# Patient Record
Sex: Male | Born: 1965 | Hispanic: No | Marital: Married | Smoking: Never smoker
Health system: Southern US, Community
[De-identification: ages and names within clinical notes are randomized; demographics above are authoritative.]

## PROBLEM LIST (undated history)

## (undated) DIAGNOSIS — E291 Testicular hypofunction: Secondary | ICD-10-CM

## (undated) DIAGNOSIS — I10 Essential (primary) hypertension: Secondary | ICD-10-CM

## (undated) DIAGNOSIS — E785 Hyperlipidemia, unspecified: Secondary | ICD-10-CM

## (undated) HISTORY — DX: Essential (primary) hypertension: I10

## (undated) HISTORY — DX: Testicular hypofunction: E29.1

## (undated) HISTORY — DX: Hyperlipidemia, unspecified: E78.5

---

## 2004-06-05 ENCOUNTER — Observation Stay (HOSPITAL_COMMUNITY): Admission: RE | Admit: 2004-06-05 | Discharge: 2004-06-06 | Payer: Self-pay | Admitting: Urology

## 2010-02-16 DIAGNOSIS — E291 Testicular hypofunction: Secondary | ICD-10-CM

## 2010-02-16 HISTORY — DX: Testicular hypofunction: E29.1

## 2011-04-24 ENCOUNTER — Ambulatory Visit (INDEPENDENT_AMBULATORY_CARE_PROVIDER_SITE_OTHER): Payer: 59 | Admitting: Internal Medicine

## 2011-04-24 ENCOUNTER — Other Ambulatory Visit (INDEPENDENT_AMBULATORY_CARE_PROVIDER_SITE_OTHER): Payer: 59

## 2011-04-24 ENCOUNTER — Encounter: Payer: Self-pay | Admitting: Internal Medicine

## 2011-04-24 VITALS — BP 150/98 | HR 78 | Temp 98.7°F | Resp 16 | Ht 69.0 in | Wt 197.0 lb

## 2011-04-24 DIAGNOSIS — Z8042 Family history of malignant neoplasm of prostate: Secondary | ICD-10-CM

## 2011-04-24 DIAGNOSIS — Z0000 Encounter for general adult medical examination without abnormal findings: Secondary | ICD-10-CM

## 2011-04-24 DIAGNOSIS — E7800 Pure hypercholesterolemia, unspecified: Secondary | ICD-10-CM

## 2011-04-24 DIAGNOSIS — J019 Acute sinusitis, unspecified: Secondary | ICD-10-CM

## 2011-04-24 DIAGNOSIS — I10 Essential (primary) hypertension: Secondary | ICD-10-CM

## 2011-04-24 DIAGNOSIS — R9431 Abnormal electrocardiogram [ECG] [EKG]: Secondary | ICD-10-CM

## 2011-04-24 LAB — CBC WITH DIFFERENTIAL/PLATELET
Basophils Absolute: 0 10*3/uL (ref 0.0–0.1)
Basophils Relative: 0.5 % (ref 0.0–3.0)
Eosinophils Absolute: 0.3 10*3/uL (ref 0.0–0.7)
HCT: 48.3 % (ref 39.0–52.0)
Lymphs Abs: 2.6 10*3/uL (ref 0.7–4.0)
MCHC: 33.7 g/dL (ref 30.0–36.0)
MCV: 84.2 fl (ref 78.0–100.0)
Monocytes Absolute: 0.8 10*3/uL (ref 0.1–1.0)
Neutro Abs: 4.8 10*3/uL (ref 1.4–7.7)
Neutrophils Relative %: 55.8 % (ref 43.0–77.0)
WBC: 8.6 10*3/uL (ref 4.5–10.5)

## 2011-04-24 LAB — COMPREHENSIVE METABOLIC PANEL
AST: 35 U/L (ref 0–37)
CO2: 28 mEq/L (ref 19–32)
Chloride: 102 mEq/L (ref 96–112)
Glucose, Bld: 78 mg/dL (ref 70–99)
Potassium: 4.1 mEq/L (ref 3.5–5.1)
Total Protein: 6.7 g/dL (ref 6.0–8.3)

## 2011-04-24 LAB — URINALYSIS, ROUTINE W REFLEX MICROSCOPIC
Bilirubin Urine: NEGATIVE
Ketones, ur: NEGATIVE
Leukocytes, UA: NEGATIVE
Nitrite: NEGATIVE
Urine Glucose: NEGATIVE

## 2011-04-24 LAB — LDL CHOLESTEROL, DIRECT: Direct LDL: 156.8 mg/dL

## 2011-04-24 MED ORDER — AMLODIPINE-OLMESARTAN 5-20 MG PO TABS: 1.0000 | ORAL_TABLET | Freq: Every day | ORAL | Status: DC

## 2011-04-24 MED ORDER — AMOXICILLIN 875 MG PO TABS: 875.0000 mg | ORAL_TABLET | Freq: Two times a day (BID) | ORAL | Status: AC

## 2011-04-24 NOTE — Patient Instructions (Signed)
Hypertension As your heart beats, it forces blood through your arteries. This force is your blood pressure. If the pressure is too high, it is called hypertension (HTN) or high blood pressure. HTN is dangerous because you may have it and not know it. High blood pressure may mean that your heart has to work harder to pump blood. Your arteries may be narrow or stiff. The extra work puts you at risk for heart disease, stroke, and other problems.  Blood pressure consists of two numbers, a higher number over a lower, 110/72, for example. It is stated as "110 over 72." The ideal is below 120 for the top number (systolic) and under 80 for the bottom (diastolic). Write down your blood pressure today. You should pay close attention to your blood pressure if you have certain conditions such as:  Heart failure.   Prior heart attack.   Diabetes   Chronic kidney disease.   Prior stroke.   Multiple risk factors for heart disease.  To see if you have HTN, your blood pressure should be measured while you are seated with your arm held at the level of the heart. It should be measured at least twice. A one-time elevated blood pressure reading (especially in the Emergency Department) does not mean that you need treatment. There may be conditions in which the blood pressure is different between your right and left arms. It is important to see your caregiver soon for a recheck. Most people have essential hypertension which means that there is not a specific cause. This type of high blood pressure may be lowered by changing lifestyle factors such as:  Stress.   Smoking.   Lack of exercise.   Excessive weight.   Drug/tobacco/alcohol use.   Eating less salt.  Most people do not have symptoms from high blood pressure until it has caused damage to the body. Effective treatment can often prevent, delay or reduce that damage. TREATMENT  When a cause has been identified, treatment for high blood pressure is  directed at the cause. There are a large number of medications to treat HTN. These fall into several categories, and your caregiver will help you select the medicines that are best for you. Medications may have side effects. You should review side effects with your caregiver. If your blood pressure stays high after you have made lifestyle changes or started on medicines,   Your medication(s) may need to be changed.   Other problems may need to be addressed.   Be certain you understand your prescriptions, and know how and when to take your medicine.   Be sure to follow up with your caregiver within the time frame advised (usually within two weeks) to have your blood pressure rechecked and to review your medications.   If you are taking more than one medicine to lower your blood pressure, make sure you know how and at what times they should be taken. Taking two medicines at the same time can result in blood pressure that is too low.  SEEK IMMEDIATE MEDICAL CARE IF:  You develop a severe headache, blurred or changing vision, or confusion.   You have unusual weakness or numbness, or a faint feeling.   You have severe chest or abdominal pain, vomiting, or breathing problems.  MAKE SURE YOU:   Understand these instructions.   Will watch your condition.   Will get help right away if you are not doing well or get worse.  Document Released: 02/02/2005 Document Revised: 01/22/2011 Document Reviewed:   09/23/2007 [employer] Patient Information 2012 [employer].Health Maintenance, Males A healthy lifestyle and preventative care can promote health and wellness.  Maintain regular health, dental, and eye exams.   Eat a healthy diet. Foods like vegetables, fruits, whole grains, low-fat dairy products, and lean protein foods contain the nutrients you need without too many calories. Decrease your intake of foods high in solid fats, added sugars, and salt. Get information about a proper diet from your  caregiver, if necessary.   Regular physical exercise is one of the most important things you can do for your health. Most adults should get at least 150 minutes of moderate-intensity exercise (any activity that increases your heart rate and causes you to sweat) each week. In addition, most adults need muscle-strengthening exercises on 2 or more days a week.    Maintain a healthy weight. The body mass index (BMI) is a screening tool to identify possible weight problems. It provides an estimate of body fat based on height and weight. Your caregiver can help determine your BMI, and can help you achieve or maintain a healthy weight. For adults 20 years and older:   A BMI below 18.5 is considered underweight.   A BMI of 18.5 to 24.9 is normal.   A BMI of 25 to 29.9 is considered overweight.   A BMI of 30 and above is considered obese.   Maintain normal blood lipids and cholesterol by exercising and minimizing your intake of saturated fat. Eat a balanced diet with plenty of fruits and vegetables. Blood tests for lipids and cholesterol should begin at age 20 and be repeated every 5 years. If your lipid or cholesterol levels are high, you are over 50, or you are a high risk for heart disease, you may need your cholesterol levels checked more frequently.Ongoing high lipid and cholesterol levels should be treated with medicines, if diet and exercise are not effective.   If you smoke, find out from your caregiver how to quit. If you do not use tobacco, do not start.   If you choose to drink alcohol, do not exceed 2 drinks per day. One drink is considered to be 12 ounces (355 mL) of beer, 5 ounces (148 mL) of wine, or 1.5 ounces (44 mL) of liquor.   Avoid use of street drugs. Do not share needles with anyone. Ask for help if you need support or instructions about stopping the use of drugs.   High blood pressure causes heart disease and increases the risk of stroke. Blood pressure should be checked at  least every 1 to 2 years. Ongoing high blood pressure should be treated with medicines if weight loss and exercise are not effective.   If you are 45 to 45 years old, ask your caregiver if you should take aspirin to prevent heart disease.   Diabetes screening involves taking a blood sample to check your fasting blood sugar level. This should be done once every 3 years, after age 45, if you are within normal weight and without risk factors for diabetes. Testing should be considered at a younger age or be carried out more frequently if you are overweight and have at least 1 risk factor for diabetes.   Colorectal cancer can be detected and often prevented. Most routine colorectal cancer screening begins at the age of 50 and continues through age 75. However, your caregiver may recommend screening at an earlier age if you have risk factors for colon cancer. On a yearly basis, your caregiver may provide   home test kits to check for hidden blood in the stool. Use of a small camera at the end of a tube, to directly examine the colon (sigmoidoscopy or colonoscopy), can detect the earliest forms of colorectal cancer. Talk to your caregiver about this at age 50, when routine screening begins. Direct examination of the colon should be repeated every 5 to 10 years through age 75, unless early forms of pre-cancerous polyps or small growths are found.   Hepatitis C blood testing is recommended for all people born from 1945 through 1965 and any individual with known risks for hepatitis C.   Healthy men should no longer receive prostate-specific antigen (PSA) blood tests as part of routine cancer screening. Consult with your caregiver about prostate cancer screening.   Testicular cancer screening is not recommended for adolescents or adult males who have no symptoms. Screening includes self-exam, caregiver exam, and other screening tests. Consult with your caregiver about any symptoms you have or any concerns you have  about testicular cancer.   Practice safe sex. Use condoms and avoid high-risk sexual practices to reduce the spread of sexually transmitted infections (STIs).   Use sunscreen with a sun protection factor (SPF) of 30 or greater. Apply sunscreen liberally and repeatedly throughout the day. You should seek shade when your shadow is shorter than you. Protect yourself by wearing long sleeves, pants, a wide-brimmed hat, and sunglasses year round, whenever you are outdoors.   Notify your caregiver of new moles or changes in moles, especially if there is a change in shape or color. Also notify your caregiver if a mole is larger than the size of a pencil eraser.   A one-time screening for abdominal aortic aneurysm (AAA) and surgical repair of large AAAs by sound wave imaging (ultrasonography) is recommended for ages 65 to 75 years who are current or former smokers.   Stay current with your immunizations.  Document Released: 08/01/2007 Document Revised: 01/22/2011 Document Reviewed: 06/30/2010 [employer] Patient Information 2012 [employer]. 

## 2011-04-24 NOTE — Progress Notes (Signed)
Subjective:    Patient ID: Gregory Rivers, male    DOB: [birth year], 46 y.o.   MRN: [id number]  Hypertension This is a chronic problem. The current episode started more than 1 year ago. The problem has been gradually worsening since onset. The problem is uncontrolled. Pertinent negatives include no anxiety, blurred vision, chest pain, headaches, malaise/fatigue, neck pain, orthopnea, palpitations, peripheral edema, PND, shortness of breath or sweats. There are no associated agents to hypertension. Past treatments include nothing. Compliance problems include exercise and diet.  There is no history of chronic renal disease.  Sinusitis This is a new problem. The current episode started 1 to 4 weeks ago. The problem has been gradually worsening since onset. There has been no fever. The fever has been present for less than 1 day. His pain is at a severity of 0/10. He is experiencing no pain. Associated symptoms include chills, congestion, coughing (productive of yellow phlegm), sinus pressure, sneezing and a sore throat. Pertinent negatives include no diaphoresis, ear pain, headaches, hoarse voice, neck pain, shortness of breath or swollen glands.  Hyperlipidemia The current episode started more than 1 year ago. The problem is controlled. Recent lipid tests were reviewed and are variable. Exacerbating diseases include obesity. He has no history of chronic renal disease, diabetes, hypothyroidism, liver disease or nephrotic syndrome. Factors aggravating his hyperlipidemia include no known factors. Pertinent negatives include no chest pain, focal sensory loss, focal weakness, leg pain, myalgias or shortness of breath. Current antihyperlipidemic treatment includes fibric acid derivatives. The current treatment provides moderate improvement of lipids. Compliance problems include adherence to exercise and adherence to diet.       Review of Systems  Constitutional: Positive for chills and unexpected weight change  (weight gain). Negative for fever, malaise/fatigue, diaphoresis, activity change, appetite change and fatigue.  HENT: Positive for congestion, sore throat, rhinorrhea, sneezing, postnasal drip and sinus pressure. Negative for hearing loss, ear pain, nosebleeds, hoarse voice, facial swelling, drooling, mouth sores, trouble swallowing, neck pain, neck stiffness, dental problem, voice change, tinnitus and ear discharge.   Eyes: Negative.  Negative for blurred vision.  Respiratory: Positive for cough (productive of yellow phlegm). Negative for apnea, choking, chest tightness, shortness of breath, wheezing and stridor.   Cardiovascular: Negative for chest pain, palpitations, orthopnea, leg swelling and PND.  Gastrointestinal: Negative for nausea, vomiting, abdominal pain, diarrhea, constipation, blood in stool, abdominal distention, anal bleeding and rectal pain.  Genitourinary: Negative for dysuria, urgency, frequency, hematuria, flank pain, decreased urine volume, discharge, penile swelling, scrotal swelling, enuresis, difficulty urinating, genital sores, penile pain and testicular pain.  Musculoskeletal: Negative for myalgias, back pain, joint swelling, arthralgias and gait problem.  Skin: Negative for color change, pallor, rash and wound.  Neurological: Negative for dizziness, tremors, focal weakness, seizures, syncope, facial asymmetry, speech difficulty, weakness, light-headedness, numbness and headaches.  Hematological: Negative for adenopathy. Does not bruise/bleed easily.  Psychiatric/Behavioral: Negative.        Objective:   Physical Exam  Vitals reviewed. Constitutional: He is oriented to person, place, and time. He appears well-developed and well-nourished. No distress.  HENT:  Head: Normocephalic and atraumatic. No trismus in the jaw.  Right Ear: Hearing, tympanic membrane, external ear and ear canal normal.  Left Ear: Hearing, tympanic membrane, external ear and ear canal normal.    Nose: Mucosal edema and rhinorrhea present. No nose lacerations, sinus tenderness, nasal deformity, septal deviation or nasal septal hematoma. No epistaxis.  No foreign bodies. Right sinus exhibits maxillary sinus tenderness. Right sinus exhibits  no frontal sinus tenderness. Left sinus exhibits maxillary sinus tenderness. Left sinus exhibits no frontal sinus tenderness.  Mouth/Throat: Oropharynx is clear and moist and mucous membranes are normal. Mucous membranes are not pale, not dry and not cyanotic. No uvula swelling. No oropharyngeal exudate, posterior oropharyngeal edema, posterior oropharyngeal erythema or tonsillar abscesses.  Eyes: Conjunctivae are normal. Right eye exhibits no discharge. Left eye exhibits no discharge. No scleral icterus.  Neck: Normal range of motion. Neck supple. No JVD present. No tracheal deviation present. No thyromegaly present.  Cardiovascular: Normal rate, regular rhythm, normal heart sounds and intact distal pulses.  Exam reveals no gallop and no friction rub.   No murmur heard. Pulmonary/Chest: Effort normal and breath sounds normal. No stridor. No respiratory distress. He has no wheezes. He has no rales. He exhibits no tenderness.  Abdominal: Soft. Bowel sounds are normal. He exhibits no distension and no mass. There is no tenderness. There is no rebound and no guarding. Hernia confirmed negative in the right inguinal area and confirmed negative in the left inguinal area.  Genitourinary: Rectum normal, prostate normal, testes normal and penis normal. Rectal exam shows no external hemorrhoid, no internal hemorrhoid, no fissure, no mass, no tenderness and anal tone normal. Guaiac negative stool. Prostate is not enlarged and not tender. Right testis shows no mass, no swelling and no tenderness. Right testis is descended. Left testis shows no mass and no swelling. Left testis is descended. Circumcised. No penile tenderness. No discharge found.  Musculoskeletal: Normal  range of motion. He exhibits no edema and no tenderness.  Lymphadenopathy:    He has no cervical adenopathy.       Right: No inguinal adenopathy present.       Left: No inguinal adenopathy present.  Neurological: He is oriented to person, place, and time.  Skin: Skin is warm and dry. No rash noted. He is not diaphoretic. No erythema. No pallor.  Psychiatric: He has a normal mood and affect. His behavior is normal. Judgment and thought content normal.          Assessment & Plan:

## 2011-04-26 ENCOUNTER — Encounter: Payer: Self-pay | Admitting: Internal Medicine

## 2011-04-26 NOTE — Assessment & Plan Note (Signed)
Start amoxil for the infection 

## 2011-04-26 NOTE — Assessment & Plan Note (Signed)
PSA check today

## 2011-04-26 NOTE — Assessment & Plan Note (Signed)
For an FLP today 

## 2011-04-26 NOTE — Assessment & Plan Note (Signed)
Start azor to control his BP

## 2011-04-26 NOTE — Assessment & Plan Note (Signed)
His EKG shows mild LAE and loss of T waves laterally, this may be a sign of hypertensive heart disease but he also has a significant family history so in addition to treating the HTN I have also sent him to see cardiology

## 2011-04-26 NOTE — Assessment & Plan Note (Signed)
Exam done, labs ordered, vaccines were updated, pt ed material was given 

## 2011-04-27 ENCOUNTER — Encounter: Payer: Self-pay | Admitting: Cardiovascular Disease

## 2011-04-27 ENCOUNTER — Ambulatory Visit (INDEPENDENT_AMBULATORY_CARE_PROVIDER_SITE_OTHER): Payer: 59 | Admitting: Cardiovascular Disease

## 2011-04-27 VITALS — BP 130/78 | HR 70 | Ht 69.0 in | Wt 196.8 lb

## 2011-04-27 DIAGNOSIS — E7800 Pure hypercholesterolemia, unspecified: Secondary | ICD-10-CM

## 2011-04-27 DIAGNOSIS — R9431 Abnormal electrocardiogram [ECG] [EKG]: Secondary | ICD-10-CM

## 2011-04-27 NOTE — Assessment & Plan Note (Signed)
LAE and nonspecific ST/T wave changes.  New Rx HTN and on cholesterol medicine.  Echo to assess chamber sizes assess LV mass and secondary effects of HTN

## 2011-04-27 NOTE — Progress Notes (Signed)
Patient ID: Gregory Rivers, male   DOB: [birth year], 46 y.o.   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  Recent Rx for HTN started.  Family history of premature valvular and ischemic disease.  Father is a patient at [facility] [employer].  No exertional SSCP, dyspnea or palpitaitons.  No previous cardiac w/u.  No old ECG;s available.  Tolerating amlodipine well.  Taking a baby aspirin daily.    ROS: Denies fever, malais, weight loss, blurry vision, decreased visual acuity, cough, sputum, SOB, hemoptysis, pleuritic pain, palpitaitons, heartburn, abdominal pain, melena, lower extremity edema, claudication, or rash.  All other systems reviewed and negative   General: Affect appropriate Healthy:  appears stated age HEENT: normal Neck supple with no adenopathy JVP normal no bruits no thyromegaly Lungs clear with no wheezing and good diaphragmatic motion Heart:  S1/S2 no murmur,rub, gallop or click PMI normal Abdomen: benighn, BS positve, no tenderness, no AAA no bruit.  No HSM or HJR Distal pulses intact with no bruits No edema Neuro non-focal Skin warm and dry No muscular weakness  Medications Current Outpatient Prescriptions  Medication Sig Dispense Refill  . amLODipine-olmesartan (AZOR) 5-20 MG per tablet Take 1 tablet by mouth daily.  56 tablet  0  . amoxicillin (AMOXIL) 875 MG tablet Take 1 tablet (875 mg total) by mouth 2 (two) times daily.  20 tablet  0  . aspirin 81 MG tablet Take 81 mg by mouth daily.      . fenofibrate 160 MG tablet Take 160 mg by mouth daily.      Marland Kitchen FLUoxetine (PROZAC) 10 MG tablet Take 10 mg by mouth daily.      . Testosterone (AXIRON) 30 MG/ACT SOLN Place onto the skin daily.        Allergies Review of patient's allergies indicates no known allergies.  Family History: Family History  Problem Relation Age of Onset  . Heart disease Mother   . Hypertension Mother   . Diabetes Mother   . Prostate cancer Father   . Hyperlipidemia Father   .  Heart disease Father   . Stroke Father   . Hypertension Father     Social History: History   Social History  . Marital Status: Married    Spouse Name: N/A    Number of Children: N/A  . Years of Education: N/A   Occupational History  . Not on file.   Social History Main Topics  . Smoking status: Never Smoker   . Smokeless tobacco: Never Used  . Alcohol Use: No  . Drug Use: No  . Sexually Active: Yes   Other Topics Concern  . Not on file   Social History Narrative  . No narrative on file    Electrocardiogram:   04/24/11  NSR rate 79 nonspecfic ST/T wave change LAE.    Assessment and Plan

## 2011-04-27 NOTE — Assessment & Plan Note (Signed)
Cholesterol is at goal.  Continue current dose of statin and diet Rx.  No myalgias or side effects.  F/U  LFT's in 6 months. No results found for this basename: LDLCALC  On fibrates F/U Dr Yetta Barre needs F/U labs

## 2011-04-27 NOTE — Patient Instructions (Signed)
Your physician wants you to follow-up in: YEAR WITH DR Haywood Filler will receive a reminder letter in the mail two months in advance. If you don't receive a letter, please call our office to schedule the follow-up appointment. Your physician recommends that you continue on your current medications as directed. Please refer to the Current Medication list given to you today. Your physician has requested that you have an echocardiogram. Echocardiography is a painless test that uses sound waves to create images of your heart. It provides your doctor with information about the size and shape of your heart and how well your heart's chambers and valves are working. This procedure takes approximately one hour. There are no restrictions for this procedure. DX ABNORMAL EKG

## 2011-05-08 ENCOUNTER — Other Ambulatory Visit: Payer: Self-pay

## 2011-05-08 ENCOUNTER — Ambulatory Visit (HOSPITAL_COMMUNITY): Payer: 59 | Attending: Internal Medicine

## 2011-05-08 DIAGNOSIS — R9431 Abnormal electrocardiogram [ECG] [EKG]: Secondary | ICD-10-CM | POA: Insufficient documentation

## 2011-05-08 DIAGNOSIS — I10 Essential (primary) hypertension: Secondary | ICD-10-CM | POA: Insufficient documentation

## 2011-05-08 DIAGNOSIS — E785 Hyperlipidemia, unspecified: Secondary | ICD-10-CM | POA: Insufficient documentation

## 2011-05-11 ENCOUNTER — Telehealth: Payer: Self-pay | Admitting: Cardiovascular Disease

## 2011-05-11 NOTE — Telephone Encounter (Signed)
Pt rtn your call

## 2011-05-11 NOTE — Telephone Encounter (Signed)
PT AWARE OF ECHO RESULTS./CY 

## 2011-05-29 ENCOUNTER — Encounter: Payer: Self-pay | Admitting: Internal Medicine

## 2011-05-29 ENCOUNTER — Ambulatory Visit (INDEPENDENT_AMBULATORY_CARE_PROVIDER_SITE_OTHER): Payer: 59 | Admitting: Internal Medicine

## 2011-05-29 VITALS — BP 118/80 | HR 78 | Temp 98.1°F | Wt 194.0 lb

## 2011-05-29 DIAGNOSIS — E7800 Pure hypercholesterolemia, unspecified: Secondary | ICD-10-CM

## 2011-05-29 DIAGNOSIS — R0683 Snoring: Secondary | ICD-10-CM

## 2011-05-29 DIAGNOSIS — I10 Essential (primary) hypertension: Secondary | ICD-10-CM

## 2011-05-29 DIAGNOSIS — G4733 Obstructive sleep apnea (adult) (pediatric): Secondary | ICD-10-CM | POA: Insufficient documentation

## 2011-05-29 DIAGNOSIS — R0609 Other forms of dyspnea: Secondary | ICD-10-CM

## 2011-05-29 NOTE — Patient Instructions (Signed)
Hypercholesterolemia High Blood Cholesterol Cholesterol is a white, waxy, fat-like protein needed by your body in small amounts. The liver makes all the cholesterol you need. It is carried from the liver by the blood through the blood vessels. Deposits (plaque) may build up on blood vessel walls. This makes the arteries narrower and stiffer. Plaque increases the risk for heart attack and stroke. You cannot feel your cholesterol level even if it is very high. The only way to know is by a blood test to check your lipid (fats) levels. Once you know your cholesterol levels, you should keep a record of the test results. Work with your caregiver to to keep your levels in the desired range. WHAT THE RESULTS MEAN:  Total cholesterol is a rough measure of all the cholesterol in your blood.   LDL is the so-called bad cholesterol. This is the type that deposits cholesterol in the walls of the arteries. You want this level to be low.   HDL is the good cholesterol because it cleans the arteries and carries the LDL away. You want this level to be high.   Triglycerides are fat that the body can either burn for energy or store. High levels are closely linked to heart disease.  DESIRED LEVELS:  Total cholesterol below 200.   LDL below 100 for people at risk, below 70 for very high risk.   HDL above 50 is good, above 60 is best.   Triglycerides below 150.  HOW TO LOWER YOUR CHOLESTEROL:  Diet.   Choose fish or white meat chicken and turkey, roasted or baked. Limit fatty cuts of red meat, fried foods, and processed meats, such as sausage and lunch meat.   Eat lots of fresh fruits and vegetables. Choose whole grains, beans, pasta, potatoes and cereals.   Use only small amounts of olive, corn or canola oils. Avoid butter, mayonnaise, shortening or palm kernel oils. Avoid foods with trans-fats.   Use skim/nonfat milk and low-fat/nonfat yogurt and cheeses. Avoid whole milk, cream, ice cream, egg yolks and  cheeses. Healthy desserts include angel food cake, gingersnaps, animal crackers, hard candy, popsicles, and low-fat/nonfat frozen yogurt. Avoid pastries, cakes, pies and cookies.   Exercise.   A regular program helps decrease LDL and raises HDL.   Helps with weight control.   Do things that increase your activity level like gardening, walking, or taking the stairs.   Medication.   May be prescribed by your caregiver to help lowering cholesterol and the risk for heart disease.   You may need medicine even if your levels are normal if you have several risk factors.  HOME CARE INSTRUCTIONS   Follow your diet and exercise programs as suggested by your caregiver.   Take medications as directed.   Have blood work done when your caregiver feels it is necessary.  MAKE SURE YOU:   Understand these instructions.   Will watch your condition.   Will get help right away if you are not doing well or get worse.  Document Released: 02/02/2005 Document Revised: 01/22/2011 Document Reviewed: 07/21/2006 [employer] Patient Information 2012 [employer].Hypertension As your heart beats, it forces blood through your arteries. This force is your blood pressure. If the pressure is too high, it is called hypertension (HTN) or high blood pressure. HTN is dangerous because you may have it and not know it. High blood pressure may mean that your heart has to work harder to pump blood. Your arteries may be narrow or stiff. The extra work   puts you at risk for heart disease, stroke, and other problems.  Blood pressure consists of two numbers, a higher number over a lower, 110/72, for example. It is stated as "110 over 72." The ideal is below 120 for the top number (systolic) and under 80 for the bottom (diastolic). Write down your blood pressure today. You should pay close attention to your blood pressure if you have certain conditions such as:  Heart failure.   Prior heart attack.   Diabetes   Chronic  kidney disease.   Prior stroke.   Multiple risk factors for heart disease.  To see if you have HTN, your blood pressure should be measured while you are seated with your arm held at the level of the heart. It should be measured at least twice. A one-time elevated blood pressure reading (especially in the Emergency Department) does not mean that you need treatment. There may be conditions in which the blood pressure is different between your right and left arms. It is important to see your caregiver soon for a recheck. Most people have essential hypertension which means that there is not a specific cause. This type of high blood pressure may be lowered by changing lifestyle factors such as:  Stress.   Smoking.   Lack of exercise.   Excessive weight.   Drug/tobacco/alcohol use.   Eating less salt.  Most people do not have symptoms from high blood pressure until it has caused damage to the body. Effective treatment can often prevent, delay or reduce that damage. TREATMENT  When a cause has been identified, treatment for high blood pressure is directed at the cause. There are a large number of medications to treat HTN. These fall into several categories, and your caregiver will help you select the medicines that are best for you. Medications may have side effects. You should review side effects with your caregiver. If your blood pressure stays high after you have made lifestyle changes or started on medicines,   Your medication(s) may need to be changed.   Other problems may need to be addressed.   Be certain you understand your prescriptions, and know how and when to take your medicine.   Be sure to follow up with your caregiver within the time frame advised (usually within two weeks) to have your blood pressure rechecked and to review your medications.   If you are taking more than one medicine to lower your blood pressure, make sure you know how and at what times they should be taken.  Taking two medicines at the same time can result in blood pressure that is too low.  SEEK IMMEDIATE MEDICAL CARE IF:  You develop a severe headache, blurred or changing vision, or confusion.   You have unusual weakness or numbness, or a faint feeling.   You have severe chest or abdominal pain, vomiting, or breathing problems.  MAKE SURE YOU:   Understand these instructions.   Will watch your condition.   Will get help right away if you are not doing well or get worse.  Document Released: 02/02/2005 Document Revised: 01/22/2011 Document Reviewed: 09/23/2007 [employer] Patient Information 2012 [employer]. 

## 2011-06-01 NOTE — Assessment & Plan Note (Signed)
He is doing well on trilipix 

## 2011-06-01 NOTE — Assessment & Plan Note (Signed)
His BP is well controlled 

## 2011-06-01 NOTE — Assessment & Plan Note (Signed)
I have asked him to see pulmonary for a sleep evaluation

## 2011-06-01 NOTE — Progress Notes (Signed)
  Subjective:    Patient ID: Gregory Rivers, male    DOB: 1965/04/07, 46 y.o.   MRN: [id number]  Hyperlipidemia This is a chronic problem. The current episode started more than 1 year ago. The problem is controlled. Recent lipid tests were reviewed and are variable. He has no history of chronic renal disease, diabetes, hypothyroidism, liver disease, obesity or nephrotic syndrome. Factors aggravating his hyperlipidemia include no known factors. Pertinent negatives include no chest pain, focal sensory loss, focal weakness, leg pain, myalgias or shortness of breath. Current antihyperlipidemic treatment includes fibric acid derivatives. The current treatment provides moderate improvement of lipids. Compliance problems include adherence to exercise and adherence to diet.   Hypertension This is a chronic problem. The current episode started more than 1 year ago. The problem has been gradually improving since onset. The problem is controlled. Pertinent negatives include no anxiety, blurred vision, chest pain, headaches, malaise/fatigue, neck pain, orthopnea, palpitations, peripheral edema, PND, shortness of breath or sweats. Past treatments include angiotensin blockers and calcium channel blockers. The current treatment provides significant improvement. Compliance problems include exercise and diet.  Identifiable causes of hypertension include sleep apnea. There is no history of chronic renal disease.      Review of Systems  Constitutional: Negative for fever, chills, malaise/fatigue, diaphoresis, activity change, appetite change, fatigue and unexpected weight change.  HENT: Negative.  Negative for neck pain.   Eyes: Negative.  Negative for blurred vision.  Respiratory: Negative for cough, choking, chest tightness, shortness of breath, wheezing and stridor. Apnea: and apnea.   Cardiovascular: Negative for chest pain, palpitations, orthopnea, leg swelling and PND.  Gastrointestinal: Negative for nausea,  vomiting, abdominal pain, diarrhea, constipation, blood in stool and anal bleeding.  Genitourinary: Negative.   Musculoskeletal: Negative for myalgias, back pain, joint swelling, arthralgias and gait problem.  Skin: Negative for color change, pallor, rash and wound.  Neurological: Negative for dizziness, tremors, focal weakness, seizures, syncope, facial asymmetry, speech difficulty, weakness, light-headedness, numbness and headaches.  Hematological: Negative for adenopathy. Does not bruise/bleed easily.  Psychiatric/Behavioral: Negative.        Objective:   Physical Exam  Vitals reviewed. Constitutional: He is oriented to person, place, and time. He appears well-developed and well-nourished. No distress.  HENT:  Head: Normocephalic and atraumatic.  Mouth/Throat: Oropharynx is clear and moist. No oropharyngeal exudate.  Eyes: Conjunctivae are normal. Right eye exhibits no discharge. Left eye exhibits no discharge. No scleral icterus.  Neck: Normal range of motion. Neck supple. No JVD present. No tracheal deviation present. No thyromegaly present.  Cardiovascular: Normal rate, regular rhythm, normal heart sounds and intact distal pulses.  Exam reveals no gallop and no friction rub.   No murmur heard. Pulmonary/Chest: Effort normal and breath sounds normal. No stridor. No respiratory distress. He has no wheezes. He has no rales. He exhibits no tenderness.  Abdominal: Soft. Bowel sounds are normal. He exhibits no distension and no mass. There is no tenderness. There is no rebound and no guarding.  Musculoskeletal: Normal range of motion. He exhibits no edema and no tenderness.  Lymphadenopathy:    He has no cervical adenopathy.  Neurological: He is oriented to person, place, and time.  Skin: Skin is warm and dry. No rash noted. He is not diaphoretic. No erythema. No pallor.  Psychiatric: He has a normal mood and affect. His behavior is normal. Judgment and thought content normal.           Assessment & Plan:

## 2011-06-22 ENCOUNTER — Encounter: Payer: Self-pay | Admitting: Pulmonary Disease

## 2011-06-22 ENCOUNTER — Ambulatory Visit (INDEPENDENT_AMBULATORY_CARE_PROVIDER_SITE_OTHER): Payer: 59 | Admitting: Pulmonary Disease

## 2011-06-22 VITALS — BP 124/80 | HR 89 | Temp 98.5°F | Ht 69.0 in | Wt 197.0 lb

## 2011-06-22 DIAGNOSIS — R0683 Snoring: Secondary | ICD-10-CM

## 2011-06-22 DIAGNOSIS — R0989 Other specified symptoms and signs involving the circulatory and respiratory systems: Secondary | ICD-10-CM

## 2011-06-22 DIAGNOSIS — R0609 Other forms of dyspnea: Secondary | ICD-10-CM

## 2011-06-22 NOTE — Patient Instructions (Signed)
Will set up for a home sleep study, and arrange followup once results are available.

## 2011-06-22 NOTE — Assessment & Plan Note (Signed)
The patient has a history of loud snoring, but also has other history that is very suggestive of sleep disordered breathing.  He has a large neck, and also a family history of obstructive sleep apnea.  At this point, I do think he needs sleep testing for evaluation, and the patient is agreeable.  He is an excellent candidate for home sleep testing given my suspicion, his age, and overall adequate health.

## 2011-06-22 NOTE — Progress Notes (Signed)
  Subjective:    Patient ID: Gregory Rivers, male    DOB: 02-01-1966, 46 y.o.   MRN: [id number]  HPI The patient is a 46 year old male who been asked to see for snoring.  He has been noted by his wife to have loud snoring, as well as an abnormal breathing pattern during sleep.  The patient denies any choking arousals, but does admit that he awakens unrested at least 50% of the mornings.  He has significant sleep pressure during the day with periods of inactivity that interferes with his quality of life, and will fall asleep very easily in the evenings watching television.  He also notes sleep pressure driving longer distances.  The patient states that his weight is up 2-3 pounds over the last few years, and his Epworth score today is 10.  Sleep Questionnaire: What time do you typically go to bed?( Between what hours) 9 pm How long does it take you to fall asleep? 5 to 10 mins How many times during the night do you wake up? 2 What time do you get out of bed to start your day? 0430 Do you drive or operate heavy machinery in your occupation? No How much has your weight changed (up or down) over the past two years? (In pounds) 3 lb (1.361 kg) Have you ever had a sleep study before? No Do you currently use CPAP? No Do you wear oxygen at any time? No    Review of Systems  Constitutional: Negative for fever and unexpected weight change.  HENT: Negative for ear pain, nosebleeds, congestion, sore throat, rhinorrhea, sneezing, trouble swallowing, dental problem, postnasal drip and sinus pressure.   Eyes: Negative for redness and itching.  Respiratory: Negative for cough, chest tightness, shortness of breath and wheezing.   Cardiovascular: Negative for palpitations and leg swelling.  Gastrointestinal: Negative for nausea and vomiting.  Genitourinary: Negative for dysuria.  Musculoskeletal: Negative for joint swelling.  Skin: Negative for rash.  Neurological: Negative for headaches.  Hematological: Does not  bruise/bleed easily.  Psychiatric/Behavioral: Negative for dysphoric mood. The patient is not nervous/anxious.        Objective:   Physical Exam Constitutional:  Overweight male, no acute distress  HENT:  Nares patent without discharge  Oropharynx without exudate, palate and uvula are moderately elongated.  Eyes:  Perrla, eomi, no scleral icterus  Neck:  No JVD, no TMG  Cardiovascular:  Normal rate, regular rhythm, no rubs or gallops.  No murmurs        Intact distal pulses  Pulmonary :  Normal breath sounds, no stridor or respiratory distress   No rales, rhonchi, or wheezing  Abdominal:  Soft, nondistended, bowel sounds present.  No tenderness noted.   Musculoskeletal:  No lower extremity edema noted.  Lymph Nodes:  No cervical lymphadenopathy noted  Skin:  No cyanosis noted  Neurologic:  Alert, appropriate, moves all 4 extremities without obvious deficit.         Assessment & Plan:

## 2011-06-29 ENCOUNTER — Telehealth: Payer: Self-pay | Admitting: Pulmonary Disease

## 2011-06-29 ENCOUNTER — Ambulatory Visit (INDEPENDENT_AMBULATORY_CARE_PROVIDER_SITE_OTHER): Payer: 59 | Admitting: Pulmonary Disease

## 2011-06-29 DIAGNOSIS — R0683 Snoring: Secondary | ICD-10-CM

## 2011-06-29 DIAGNOSIS — G4733 Obstructive sleep apnea (adult) (pediatric): Secondary | ICD-10-CM

## 2011-06-29 NOTE — Telephone Encounter (Signed)
Gregory Rivers, this pt needs ov to review his recent sleep study.  Thanks.

## 2011-07-01 NOTE — Telephone Encounter (Signed)
Pt is scheduled for Fri., 5/17 @ 9:30am to review sleep results.

## 2011-07-03 ENCOUNTER — Ambulatory Visit (INDEPENDENT_AMBULATORY_CARE_PROVIDER_SITE_OTHER): Payer: 59 | Admitting: Pulmonary Disease

## 2011-07-03 ENCOUNTER — Encounter: Payer: Self-pay | Admitting: Pulmonary Disease

## 2011-07-03 VITALS — BP 118/80 | HR 78 | Temp 98.4°F | Ht 69.0 in | Wt 194.0 lb

## 2011-07-03 DIAGNOSIS — G4733 Obstructive sleep apnea (adult) (pediatric): Secondary | ICD-10-CM

## 2011-07-03 NOTE — Assessment & Plan Note (Signed)
The patient has mild to moderate obstructive sleep apnea by his recent home sleep study.  He is also describing sleep disruption and daytime sleepiness.  Given the severity of his sleep apnea, I do not think this is a big cardiovascular risk for him.  However, he is clearly symptomatic, and will need to decide between conservative or aggressive treatment.  I have outlined a conservative treatment such as a trial of weight loss for the next 4-6 months.  If he wished to treat this more aggressively, I have suggested considering either a dental appliance or CPAP.  The patient is leaning toward a trial of weight loss alone, but I have given him patient education materials to consider further more aggressive therapies as well.

## 2011-07-03 NOTE — Patient Instructions (Signed)
Work on weight loss, but consider whether to start on dental appliance or cpap as well. Please give me some feedback on your decision and how things are going.

## 2011-07-03 NOTE — Progress Notes (Signed)
  Subjective:    Patient ID: Gregory Rivers, male    DOB: 28-May-1965, 46 y.o.   MRN: [id number]  HPI The patient comes in today for followup of his recent sleep test.  He was found to have mild to moderate obstructive sleep apnea with an AHI of 19 events per hour, and oxygen desaturation as low as 78%.  I have reviewed the study with him in detail, and answered all of his questions.   Review of Systems  Constitutional: Negative.  Negative for fever and unexpected weight change.  HENT: Negative.  Negative for ear pain, nosebleeds, congestion, sore throat, rhinorrhea, sneezing, trouble swallowing, dental problem, postnasal drip and sinus pressure.   Eyes: Negative.  Negative for redness and itching.  Respiratory: Negative.  Negative for cough, chest tightness, shortness of breath and wheezing.   Cardiovascular: Negative.  Negative for palpitations and leg swelling.  Gastrointestinal: Negative.  Negative for nausea and vomiting.  Genitourinary: Negative.  Negative for dysuria.  Musculoskeletal: Negative.  Negative for joint swelling.  Skin: Negative.  Negative for rash.  Neurological: Negative.  Negative for headaches.  Hematological: Negative.  Does not bruise/bleed easily.  Psychiatric/Behavioral: Negative.  Negative for dysphoric mood. The patient is not nervous/anxious.        Objective:   Physical Exam Overweight male in no acute distress Nose without purulence or discharge noted Lower extremities without edema, no cyanosis Alert and oriented, moves all 4 extremities.  The patient does appear mildly sleepy during my visit.       Assessment & Plan:

## 2011-07-22 ENCOUNTER — Other Ambulatory Visit: Payer: Self-pay

## 2011-07-22 MED ORDER — FENOFIBRATE 160 MG PO TABS: 160.0000 mg | ORAL_TABLET | Freq: Every day | ORAL | Status: DC

## 2011-09-01 ENCOUNTER — Other Ambulatory Visit: Payer: Self-pay

## 2011-09-01 MED ORDER — FLUOXETINE HCL 10 MG PO TABS: 10.0000 mg | ORAL_TABLET | Freq: Every day | ORAL | Status: DC

## 2011-09-23 ENCOUNTER — Encounter: Payer: Self-pay | Admitting: Internal Medicine

## 2011-09-23 ENCOUNTER — Ambulatory Visit (INDEPENDENT_AMBULATORY_CARE_PROVIDER_SITE_OTHER): Payer: 59 | Admitting: Internal Medicine

## 2011-09-23 VITALS — BP 118/70 | HR 79 | Temp 98.7°F | Resp 16 | Wt 194.2 lb

## 2011-09-23 DIAGNOSIS — E7800 Pure hypercholesterolemia, unspecified: Secondary | ICD-10-CM

## 2011-09-23 DIAGNOSIS — M5416 Radiculopathy, lumbar region: Secondary | ICD-10-CM | POA: Insufficient documentation

## 2011-09-23 DIAGNOSIS — I10 Essential (primary) hypertension: Secondary | ICD-10-CM

## 2011-09-23 DIAGNOSIS — M79605 Pain in left leg: Secondary | ICD-10-CM

## 2011-09-23 DIAGNOSIS — M545 Low back pain, unspecified: Secondary | ICD-10-CM

## 2011-09-23 DIAGNOSIS — IMO0002 Reserved for concepts with insufficient information to code with codable children: Secondary | ICD-10-CM

## 2011-09-23 DIAGNOSIS — M5136 Other intervertebral disc degeneration, lumbar region: Secondary | ICD-10-CM | POA: Insufficient documentation

## 2011-09-23 NOTE — Assessment & Plan Note (Signed)
He has been taking neurontin 300 mg at HS with minimal symptom relief, I asked him to increase that to 600 mg at HS

## 2011-09-23 NOTE — Patient Instructions (Signed)
Back Pain, Adult Low back pain is very common. About 1 in 5 people have back pain.The cause of low back pain is rarely dangerous. The pain often gets better over time.About half of people with a sudden onset of back pain feel better in just 2 weeks. About 8 in 10 people feel better by 6 weeks.  CAUSES Some common causes of back pain include:  Strain of the muscles or ligaments supporting the spine.   Wear and tear (degeneration) of the spinal discs.   Arthritis.   Direct injury to the back.  DIAGNOSIS Most of the time, the direct cause of low back pain is not known.However, back pain can be treated effectively even when the exact cause of the pain is unknown.Answering your caregiver's questions about your overall health and symptoms is one of the most accurate ways to make sure the cause of your pain is not dangerous. If your caregiver needs more information, he or she may order lab work or imaging tests (X-rays or MRIs).However, even if imaging tests show changes in your back, this usually does not require surgery. HOME CARE INSTRUCTIONS For many people, back pain returns.Since low back pain is rarely dangerous, it is often a condition that people can learn to manageon their own.   Remain active. It is stressful on the back to sit or stand in one place. Do not sit, drive, or stand in one place for more than 30 minutes at a time. Take short walks on level surfaces as soon as pain allows.Try to increase the length of time you walk each day.   Do not stay in bed.Resting more than 1 or 2 days can delay your recovery.   Do not avoid exercise or work.Your body is made to move.It is not dangerous to be active, even though your back may hurt.Your back will likely heal faster if you return to being active before your pain is gone.   Pay attention to your body when you bend and lift. Many people have less discomfortwhen lifting if they bend their knees, keep the load close to their  bodies,and avoid twisting. Often, the most comfortable positions are those that put less stress on your recovering back.   Find a comfortable position to sleep. Use a firm mattress and lie on your side with your knees slightly bent. If you lie on your back, put a pillow under your knees.   Only take over-the-counter or prescription medicines as directed by your caregiver. Over-the-counter medicines to reduce pain and inflammation are often the most helpful.Your caregiver may prescribe muscle relaxant drugs.These medicines help dull your pain so you can more quickly return to your normal activities and healthy exercise.   Put ice on the injured area.   Put ice in a plastic bag.   Place a towel between your skin and the bag.   Leave the ice on for 15 to 20 minutes, 3 to 4 times a day for the first 2 to 3 days. After that, ice and heat may be alternated to reduce pain and spasms.   Ask your caregiver about trying back exercises and gentle massage. This may be of some benefit.   Avoid feeling anxious or stressed.Stress increases muscle tension and can worsen back pain.It is important to recognize when you are anxious or stressed and learn ways to manage it.Exercise is a great option.  SEEK MEDICAL CARE IF:  You have pain that is not relieved with rest or medicine.   You have   pain that does not improve in 1 week.   You have new symptoms.   You are generally not feeling well.  SEEK IMMEDIATE MEDICAL CARE IF:   You have pain that radiates from your back into your legs.   You develop new bowel or bladder control problems.   You have unusual weakness or numbness in your arms or legs.   You develop nausea or vomiting.   You develop abdominal pain.   You feel faint.  Document Released: 02/02/2005 Document Revised: 01/22/2011 Document Reviewed: 06/23/2010 [employer] Patient Information 2012 [employer]. 

## 2011-09-23 NOTE — Assessment & Plan Note (Signed)
His BP is well controlled 

## 2011-09-23 NOTE — Progress Notes (Signed)
Subjective:    Patient ID: Gregory Rivers, male    DOB: 1965-08-30, 46 y.o.   MRN: [id number]  Back Pain This is a new problem. Episode onset: 2 months ago. The problem occurs intermittently. The problem has been gradually worsening since onset. The pain is present in the lumbar spine. The quality of the pain is described as stabbing. The pain radiates to the left foot. The pain is at a severity of 6/10. The pain is moderate. The pain is worse during the night. The symptoms are aggravated by standing and lying down. Stiffness is present at night. Associated symptoms include leg pain, numbness (in the left leg), paresthesias and tingling (in the left leg). Pertinent negatives include no abdominal pain, bladder incontinence, bowel incontinence, chest pain, dysuria, fever, headaches, paresis, pelvic pain, perianal numbness, weakness or weight loss. Risk factors include obesity and lack of exercise. He has tried NSAIDs (he saw ? ortho and was given neurontin, it helps some) for the symptoms. The treatment provided moderate relief.      Review of Systems  Constitutional: Negative for fever, chills, weight loss, diaphoresis, activity change, appetite change, fatigue and unexpected weight change.  HENT: Negative.   Eyes: Negative.   Respiratory: Negative for cough, chest tightness, shortness of breath, wheezing and stridor.   Cardiovascular: Negative for chest pain, palpitations and leg swelling.  Gastrointestinal: Negative for nausea, vomiting, abdominal pain, diarrhea, constipation, abdominal distention, anal bleeding and bowel incontinence.  Genitourinary: Negative.  Negative for bladder incontinence, dysuria and pelvic pain.  Musculoskeletal: Positive for back pain. Negative for myalgias, joint swelling, arthralgias and gait problem.  Skin: Negative for color change, pallor, rash and wound.  Neurological: Positive for tingling (in the left leg), numbness (in the left leg) and paresthesias. Negative  for dizziness, tremors, seizures, syncope, facial asymmetry, speech difficulty, weakness, light-headedness and headaches.  Hematological: Negative for adenopathy.  Psychiatric/Behavioral: Negative.        Objective:   Physical Exam  Vitals reviewed. Constitutional: He appears well-developed and well-nourished. No distress.  HENT:  Head: Normocephalic and atraumatic.  Mouth/Throat: Oropharynx is clear and moist. No oropharyngeal exudate.  Eyes: Conjunctivae are normal. Right eye exhibits no discharge. Left eye exhibits no discharge. No scleral icterus.  Neck: Normal range of motion. Neck supple. No JVD present. No tracheal deviation present. No thyromegaly present.  Cardiovascular: Normal rate, regular rhythm, normal heart sounds and intact distal pulses.  Exam reveals no gallop and no friction rub.   No murmur heard. Pulmonary/Chest: Effort normal and breath sounds normal. No stridor. No respiratory distress. He has no wheezes. He has no rales. He exhibits no tenderness.  Abdominal: Soft. Bowel sounds are normal. He exhibits no distension and no mass. There is no tenderness. There is no rebound and no guarding.  Musculoskeletal: Normal range of motion. He exhibits no edema and no tenderness.       Lumbar back: Normal. He exhibits normal range of motion, no tenderness, no bony tenderness, no swelling, no edema, no deformity, no laceration, no pain, no spasm and normal pulse.  Lymphadenopathy:    He has no cervical adenopathy.  Neurological: He is alert. He has normal strength. He displays abnormal reflex. He displays no atrophy and no tremor. No cranial nerve deficit or sensory deficit. He exhibits normal muscle tone. He displays a negative Romberg sign. He displays no seizure activity. Coordination and gait normal. He displays no Babinski's sign on the right side. He displays no Babinski's sign on the left side.  Reflex Scores:      Tricep reflexes are 2+ on the right side and 2+ on the left  side.      Bicep reflexes are 2+ on the right side and 2+ on the left side.      Brachioradialis reflexes are 2+ on the right side and 2+ on the left side.      Patellar reflexes are 2+ on the right side and 1+ on the left side.      Achilles reflexes are 2+ on the right side and 1+ on the left side.      - SLR in BLE  Skin: Skin is warm and dry. No rash noted. He is not diaphoretic. No erythema. No pallor.  Psychiatric: He has a normal mood and affect. His behavior is normal. Judgment and thought content normal.      Lab Results  Component Value Date   WBC 8.6 04/24/2011   HGB 16.3 04/24/2011   HCT 48.3 04/24/2011   PLT 212.0 04/24/2011   GLUCOSE 78 04/24/2011   CHOL 222* 04/24/2011   TRIG 260.0* 04/24/2011   HDL 37.40* 04/24/2011   LDLDIRECT 156.8 04/24/2011   ALT 56* 04/24/2011   AST 35 04/24/2011   NA 139 04/24/2011   K 4.1 04/24/2011   CL 102 04/24/2011   CREATININE 1.3 04/24/2011   BUN 18 04/24/2011   CO2 28 04/24/2011   TSH 1.95 04/24/2011   PSA 0.43 04/24/2011      Assessment & Plan:

## 2011-09-23 NOTE — Assessment & Plan Note (Signed)
He has pain, paresthesias, and abnormal reflexes so I have asked him to get an MRI of L/S spine done to look for spinal stenosis, HNP, tumor, lesion, etc

## 2011-10-16 ENCOUNTER — Ambulatory Visit
Admission: RE | Admit: 2011-10-16 | Discharge: 2011-10-16 | Disposition: A | Discharge status: Home / Self Care | Payer: 59 | Source: Ambulatory Visit | Attending: Internal Medicine | Admitting: Internal Medicine

## 2011-10-16 DIAGNOSIS — M79605 Pain in left leg: Secondary | ICD-10-CM

## 2011-10-16 DIAGNOSIS — M5416 Radiculopathy, lumbar region: Secondary | ICD-10-CM

## 2011-10-19 ENCOUNTER — Encounter: Payer: Self-pay | Admitting: Internal Medicine

## 2011-10-27 ENCOUNTER — Ambulatory Visit (INDEPENDENT_AMBULATORY_CARE_PROVIDER_SITE_OTHER): Payer: 59 | Admitting: Internal Medicine

## 2011-10-27 ENCOUNTER — Encounter: Payer: Self-pay | Admitting: Internal Medicine

## 2011-10-27 VITALS — BP 138/82 | HR 80 | Temp 97.9°F | Resp 16 | Wt 195.0 lb

## 2011-10-27 DIAGNOSIS — M5416 Radiculopathy, lumbar region: Secondary | ICD-10-CM

## 2011-10-27 DIAGNOSIS — I10 Essential (primary) hypertension: Secondary | ICD-10-CM

## 2011-10-27 DIAGNOSIS — M5136 Other intervertebral disc degeneration, lumbar region: Secondary | ICD-10-CM

## 2011-10-27 DIAGNOSIS — M5137 Other intervertebral disc degeneration, lumbosacral region: Secondary | ICD-10-CM

## 2011-10-27 DIAGNOSIS — IMO0002 Reserved for concepts with insufficient information to code with codable children: Secondary | ICD-10-CM

## 2011-10-29 ENCOUNTER — Encounter: Payer: Self-pay | Admitting: Internal Medicine

## 2011-10-29 NOTE — Progress Notes (Signed)
Subjective:    Patient ID: Gregory Rivers, male    DOB: 22-Nov-1965, 46 y.o.   MRN: [id number]  Hypertension This is a chronic problem. The current episode started more than 1 year ago. The problem has been gradually improving since onset. The problem is controlled. Pertinent negatives include no anxiety, blurred vision, chest pain, headaches, malaise/fatigue, neck pain, orthopnea, palpitations, peripheral edema, PND, shortness of breath or sweats. Past treatments include angiotensin blockers and calcium channel blockers. There are no compliance problems.   Back Pain This is a recurrent problem. The current episode started more than 1 month ago. The problem occurs intermittently. The problem has been gradually improving since onset. The pain is present in the lumbar spine. The quality of the pain is described as stabbing and aching. The pain radiates to the left thigh. The pain is at a severity of 2/10. The pain is mild. The pain is worse during the night. The symptoms are aggravated by lying down. Stiffness is present at night. Pertinent negatives include no abdominal pain, bladder incontinence, bowel incontinence, chest pain, dysuria, fever, headaches, leg pain, numbness, paresis, paresthesias, pelvic pain, perianal numbness, tingling, weakness or weight loss. Treatments tried: neurontin. The treatment provided moderate relief.      Review of Systems  Constitutional: Negative for fever, chills, weight loss, malaise/fatigue, diaphoresis, activity change, appetite change, fatigue and unexpected weight change.  HENT: Negative.  Negative for neck pain.   Eyes: Negative.  Negative for blurred vision.  Respiratory: Negative for cough, choking, chest tightness, shortness of breath, wheezing and stridor.   Cardiovascular: Negative for chest pain, palpitations, orthopnea, leg swelling and PND.  Gastrointestinal: Negative for nausea, vomiting, abdominal pain, diarrhea, constipation, blood in stool and bowel  incontinence.  Genitourinary: Negative.  Negative for bladder incontinence, dysuria and pelvic pain.  Musculoskeletal: Positive for back pain. Negative for myalgias, joint swelling, arthralgias and gait problem.  Skin: Negative for color change, pallor, rash and wound.  Neurological: Negative for dizziness, tingling, tremors, seizures, syncope, facial asymmetry, speech difficulty, weakness, light-headedness, numbness, headaches and paresthesias.  Hematological: Negative for adenopathy. Does not bruise/bleed easily.  Psychiatric/Behavioral: Negative.        Objective:   Physical Exam  Vitals reviewed. Constitutional: He is oriented to person, place, and time. He appears well-developed and well-nourished. No distress.  HENT:  Head: Normocephalic and atraumatic.  Mouth/Throat: Oropharynx is clear and moist. No oropharyngeal exudate.  Eyes: Conjunctivae normal are normal. Right eye exhibits no discharge. Left eye exhibits no discharge. No scleral icterus.  Neck: Normal range of motion. Neck supple. No JVD present. No tracheal deviation present. No thyromegaly present.  Cardiovascular: Normal rate, regular rhythm, normal heart sounds and intact distal pulses.  Exam reveals no gallop and no friction rub.   No murmur heard. Pulmonary/Chest: Effort normal and breath sounds normal. No stridor. No respiratory distress. He has no wheezes. He has no rales. He exhibits no tenderness.  Abdominal: Soft. Bowel sounds are normal. He exhibits no distension. There is no tenderness. There is no rebound and no guarding.  Musculoskeletal: Normal range of motion. He exhibits no edema and no tenderness.  Lymphadenopathy:    He has no cervical adenopathy.  Neurological: He is alert and oriented to person, place, and time. He has normal reflexes. He displays normal reflexes. No cranial nerve deficit. He exhibits normal muscle tone. Coordination normal.  Skin: Skin is warm and dry. No rash noted. He is not  diaphoretic. No erythema. No pallor.  Psychiatric: He has  a normal mood and affect. His behavior is normal. Judgment and thought content normal.          Assessment & Plan:

## 2011-10-29 NOTE — Assessment & Plan Note (Signed)
His Bp is well controlled 

## 2011-10-29 NOTE — Assessment & Plan Note (Signed)
Continue neurontin and nsaids as needed

## 2011-10-29 NOTE — Assessment & Plan Note (Signed)
He is doing well on neurontin and does not want to pursue anything else to treat his pain

## 2011-10-29 NOTE — Patient Instructions (Addendum)
Back Pain, Adult Low back pain is very common. About 1 in 5 people have back pain.The cause of low back pain is rarely dangerous. The pain often gets better over time.About half of people with a sudden onset of back pain feel better in just 2 weeks. About 8 in 10 people feel better by 6 weeks.  CAUSES Some common causes of back pain include:  Strain of the muscles or ligaments supporting the spine.   Wear and tear (degeneration) of the spinal discs.   Arthritis.   Direct injury to the back.  DIAGNOSIS Most of the time, the direct cause of low back pain is not known.However, back pain can be treated effectively even when the exact cause of the pain is unknown.Answering your caregiver's questions about your overall health and symptoms is one of the most accurate ways to make sure the cause of your pain is not dangerous. If your caregiver needs more information, he or she may order lab work or imaging tests (X-rays or MRIs).However, even if imaging tests show changes in your back, this usually does not require surgery. HOME CARE INSTRUCTIONS For many people, back pain returns.Since low back pain is rarely dangerous, it is often a condition that people can learn to manageon their own.   Remain active. It is stressful on the back to sit or stand in one place. Do not sit, drive, or stand in one place for more than 30 minutes at a time. Take short walks on level surfaces as soon as pain allows.Try to increase the length of time you walk each day.   Do not stay in bed.Resting more than 1 or 2 days can delay your recovery.   Do not avoid exercise or work.Your body is made to move.It is not dangerous to be active, even though your back may hurt.Your back will likely heal faster if you return to being active before your pain is gone.   Pay attention to your body when you bend and lift. Many people have less discomfortwhen lifting if they bend their knees, keep the load close to their  bodies,and avoid twisting. Often, the most comfortable positions are those that put less stress on your recovering back.   Find a comfortable position to sleep. Use a firm mattress and lie on your side with your knees slightly bent. If you lie on your back, put a pillow under your knees.   Only take over-the-counter or prescription medicines as directed by your caregiver. Over-the-counter medicines to reduce pain and inflammation are often the most helpful.Your caregiver may prescribe muscle relaxant drugs.These medicines help dull your pain so you can more quickly return to your normal activities and healthy exercise.   Put ice on the injured area.   Put ice in a plastic bag.   Place a towel between your skin and the bag.   Leave the ice on for 15 to 20 minutes, 3 to 4 times a day for the first 2 to 3 days. After that, ice and heat may be alternated to reduce pain and spasms.   Ask your caregiver about trying back exercises and gentle massage. This may be of some benefit.   Avoid feeling anxious or stressed.Stress increases muscle tension and can worsen back pain.It is important to recognize when you are anxious or stressed and learn ways to manage it.Exercise is a great option.  SEEK MEDICAL CARE IF:  You have pain that is not relieved with rest or medicine.   You have   pain that does not improve in 1 week.   You have new symptoms.   You are generally not feeling well.  SEEK IMMEDIATE MEDICAL CARE IF:   You have pain that radiates from your back into your legs.   You develop new bowel or bladder control problems.   You have unusual weakness or numbness in your arms or legs.   You develop nausea or vomiting.   You develop abdominal pain.   You feel faint.  Document Released: 02/02/2005 Document Revised: 01/22/2011 Document Reviewed: 06/23/2010 [employer] Patient Information 2012 [employer]. 

## 2011-12-17 ENCOUNTER — Telehealth: Payer: Self-pay | Admitting: Pulmonary Disease

## 2011-12-17 NOTE — Telephone Encounter (Signed)
Spoke with patient, patient states he Dx with sleep apnea in May. Says x1 night ago he had a panic attack while trying to sleep. Patient says he is ready to start on CPAP machine at this time. Patient is aware Dr. Shelle Iron is out of office and pt is ok with him getting back to him next week.  Nothing further needed at this time.

## 2011-12-21 ENCOUNTER — Other Ambulatory Visit: Payer: Self-pay | Admitting: Pulmonary Disease

## 2011-12-21 DIAGNOSIS — G4733 Obstructive sleep apnea (adult) (pediatric): Secondary | ICD-10-CM

## 2011-12-21 NOTE — Telephone Encounter (Signed)
LMTCB and will leave in the triage since I closed in error

## 2011-12-21 NOTE — Telephone Encounter (Signed)
Let him know will send an order to [facility] who will send an order to a dme.  He is to followup with me in 5-6 weeks, but call if he is having any tolerance issues.

## 2012-01-05 ENCOUNTER — Telehealth: Payer: Self-pay

## 2012-01-05 DIAGNOSIS — M5416 Radiculopathy, lumbar region: Secondary | ICD-10-CM

## 2012-01-05 NOTE — Telephone Encounter (Signed)
done

## 2012-01-05 NOTE — Telephone Encounter (Signed)
Patient called c/o continued back pain and would now like a referral to see a specialist.

## 2012-01-19 ENCOUNTER — Telehealth: Payer: Self-pay | Admitting: Internal Medicine

## 2012-01-19 NOTE — Telephone Encounter (Signed)
Patient Information:  Caller Name: [facility]  Phone: [phone]  Patient: Gregory Rivers, Gregory Rivers  Gender: Male  DOB: Nov 15, 1965  Age: 46 Years  PCP: Sanda Linger (Adults only)   Symptoms  Reason For Call & Symptoms: hoarseness x 3 weeks, sore throat.  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  States recently started sleep apnea machine.  Reviewed Health History In EMR: Yes  Reviewed Medications In EMR: Yes  Reviewed Allergies In EMR: Yes  Reviewed Surgeries / Procedures: Yes  Date of Onset of Symptoms: 01/05/2012  Guideline(s) Used:  Sore Throat  Disposition Per Guideline:   Strep Test Only Visit Today or Tomorrow  Reason For Disposition Reached:   Sore throat is the main symptom and persists > 48 hours  Advice Given:  N/A  Office Follow Up:  Does the office need to follow up with this patient?: No  Instructions For The Office: N/A  Appointment Scheduled:  01/20/2012 09:15:00 Appointment Scheduled Provider:  Sanda Linger (Adults only)  RN Note:  Per protocol, emergent symptoms denied; appt scheduled 01/20/12 0915 with Dr. Yetta Barre.

## 2012-01-20 ENCOUNTER — Ambulatory Visit (INDEPENDENT_AMBULATORY_CARE_PROVIDER_SITE_OTHER): Payer: 59 | Admitting: Internal Medicine

## 2012-01-20 ENCOUNTER — Encounter: Payer: Self-pay | Admitting: Internal Medicine

## 2012-01-20 VITALS — BP 122/64 | HR 74 | Temp 98.4°F | Resp 16 | Ht 69.0 in | Wt 197.2 lb

## 2012-01-20 DIAGNOSIS — I10 Essential (primary) hypertension: Secondary | ICD-10-CM

## 2012-01-20 DIAGNOSIS — K219 Gastro-esophageal reflux disease without esophagitis: Secondary | ICD-10-CM

## 2012-01-20 DIAGNOSIS — J040 Acute laryngitis: Secondary | ICD-10-CM

## 2012-01-20 MED ORDER — METHYLPREDNISOLONE ACETATE 80 MG/ML IJ SUSP
120.0000 mg | Freq: Once | INTRAMUSCULAR | Status: AC
  Administered 2012-01-20: 120 mg via INTRAMUSCULAR

## 2012-01-20 MED ORDER — ESOMEPRAZOLE MAGNESIUM 40 MG PO CPDR: 40.0000 mg | DELAYED_RELEASE_CAPSULE | Freq: Every day | ORAL | Status: DC

## 2012-01-20 NOTE — Patient Instructions (Addendum)

## 2012-01-20 NOTE — Assessment & Plan Note (Signed)
He was given an injection of depo-medrol IM today to reduce the laryngeal inflammation Treat the GERD with nexium

## 2012-01-20 NOTE — Progress Notes (Signed)
Subjective:    Patient ID: Gregory Rivers, male    DOB: 01-May-1965, 45 y.o.   MRN: [id number]  Gastrophageal Reflux He complains of heartburn, a hoarse voice and water brash. He reports no abdominal pain, no belching, no chest pain, no choking, no coughing, no dysphagia, no early satiety, no globus sensation, no nausea, no sore throat, no stridor, no tooth decay or no wheezing. This is a recurrent problem. The current episode started more than 1 month ago. The problem occurs occasionally. The problem has been gradually worsening. The heartburn duration is less than a minute. The heartburn is located in the substernum. The heartburn is of mild intensity. The heartburn wakes him from sleep. The heartburn does not limit his activity. The heartburn doesn't change with position. Nothing aggravates the symptoms. Pertinent negatives include no anemia, fatigue, melena, muscle weakness, orthopnea or weight loss. Risk factors include lack of exercise, NSAIDs and obesity. He has tried an antacid (tums) for the symptoms. The treatment provided mild relief.      Review of Systems  Constitutional: Positive for unexpected weight change (weight gain). Negative for fever, chills, weight loss, diaphoresis, activity change, appetite change and fatigue.  HENT: Positive for hoarse voice and voice change. Negative for sore throat, facial swelling, drooling, trouble swallowing and sinus pressure.   Eyes: Negative.   Respiratory: Positive for apnea. Negative for cough, choking, chest tightness, shortness of breath, wheezing and stridor.   Cardiovascular: Negative for chest pain, palpitations and leg swelling.  Gastrointestinal: Positive for heartburn. Negative for dysphagia, nausea, vomiting, abdominal pain, diarrhea, constipation, blood in stool, melena, abdominal distention, anal bleeding and rectal pain.  Genitourinary: Negative.   Musculoskeletal: Positive for back pain. Negative for myalgias, joint swelling,  arthralgias, gait problem and muscle weakness.  Skin: Negative.   Neurological: Negative for dizziness, weakness and light-headedness.  Hematological: Negative for adenopathy. Does not bruise/bleed easily.  Psychiatric/Behavioral: Negative.        Objective:   Physical Exam  Vitals reviewed. Constitutional: He is oriented to person, place, and time. He appears well-developed and well-nourished.  Non-toxic appearance. He does not have a sickly appearance. He does not appear ill. No distress.       He has a mildly raspy voice  HENT:  Head: Normocephalic and atraumatic.  Mouth/Throat: Oropharynx is clear and moist. No oropharyngeal exudate.  Eyes: Conjunctivae normal are normal. Right eye exhibits no discharge. Left eye exhibits no discharge. No scleral icterus.  Neck: Normal range of motion. Neck supple. No JVD present. No tracheal deviation present. No thyromegaly present.  Cardiovascular: Normal rate, regular rhythm, normal heart sounds and intact distal pulses.  Exam reveals no gallop and no friction rub.   No murmur heard. Pulmonary/Chest: Effort normal and breath sounds normal. No stridor. No respiratory distress. He has no wheezes. He has no rales. He exhibits no tenderness.  Abdominal: Soft. Bowel sounds are normal. He exhibits no distension and no mass. There is no tenderness. There is no rebound and no guarding.  Musculoskeletal: Normal range of motion. He exhibits no edema and no tenderness.  Lymphadenopathy:    He has no cervical adenopathy.  Neurological: He is oriented to person, place, and time.  Skin: Skin is warm and dry. No rash noted. He is not diaphoretic. No erythema. No pallor.  Psychiatric: He has a normal mood and affect. His behavior is normal. Judgment and thought content normal.     Lab Results  Component Value Date   WBC 8.6 04/24/2011  HGB 16.3 04/24/2011   HCT 48.3 04/24/2011   PLT 212.0 04/24/2011   GLUCOSE 78 04/24/2011   CHOL 222* 04/24/2011   TRIG 260.0*  04/24/2011   HDL 37.40* 04/24/2011   LDLDIRECT 156.8 04/24/2011   ALT 56* 04/24/2011   AST 35 04/24/2011   NA 139 04/24/2011   K 4.1 04/24/2011   CL 102 04/24/2011   CREATININE 1.3 04/24/2011   BUN 18 04/24/2011   CO2 28 04/24/2011   TSH 1.95 04/24/2011   PSA 0.43 04/24/2011       Assessment & Plan:

## 2012-01-20 NOTE — Assessment & Plan Note (Signed)
His BP is well controlled 

## 2012-01-20 NOTE — Assessment & Plan Note (Signed)
Start nexium 

## 2012-02-01 ENCOUNTER — Other Ambulatory Visit: Payer: Self-pay | Admitting: Internal Medicine

## 2012-02-19 ENCOUNTER — Encounter: Payer: Self-pay | Admitting: Internal Medicine

## 2012-02-19 ENCOUNTER — Ambulatory Visit (INDEPENDENT_AMBULATORY_CARE_PROVIDER_SITE_OTHER): Payer: 59 | Admitting: Internal Medicine

## 2012-02-19 VITALS — BP 132/80 | HR 89 | Temp 98.0°F | Ht 69.0 in | Wt 190.8 lb

## 2012-02-19 DIAGNOSIS — R059 Cough, unspecified: Secondary | ICD-10-CM

## 2012-02-19 DIAGNOSIS — J209 Acute bronchitis, unspecified: Secondary | ICD-10-CM

## 2012-02-19 DIAGNOSIS — R05 Cough: Secondary | ICD-10-CM

## 2012-02-19 DIAGNOSIS — F410 Panic disorder [episodic paroxysmal anxiety] without agoraphobia: Secondary | ICD-10-CM

## 2012-02-19 MED ORDER — HYDROCODONE-HOMATROPINE 5-1.5 MG/5ML PO SYRP: 5.0000 mL | ORAL_SOLUTION | Freq: Three times a day (TID) | ORAL | Status: DC | PRN

## 2012-02-19 MED ORDER — AZITHROMYCIN 250 MG PO TABS: ORAL_TABLET | ORAL | Status: DC

## 2012-02-19 MED ORDER — ALPRAZOLAM 0.5 MG PO TABS: 0.5000 mg | ORAL_TABLET | Freq: Three times a day (TID) | ORAL | Status: DC | PRN

## 2012-02-19 NOTE — Progress Notes (Signed)
HPI  Pt presents to the clinic with c/o o fa sinus infection that has progressed down into his chest. He started off 10 days ago with headache and nasal congestion. He took some Sudafed and the nasal congestion and headache resolved. He then started having a sore throat and productive cough with thick yellow sputum. He has these coughing attacks at night which is causing him to have a panic attack because he feels like he can't breath. He does have a history of anxiety but has never had a panic attack before. He realizes that he can breath, and does not understand why he feels like he can't breath. He denies fever, chills or body aches. He has no history of allergies and asthma. He has had sick contacts.  Review of Systems    Past Medical History  Diagnosis Date  . Hypogonadism male 2012  . Hyperlipidemia   . Hypertension     Family History  Problem Relation Age of Onset  . Heart disease Mother   . Hypertension Mother   . Diabetes Mother   . Prostate cancer Father   . Hyperlipidemia Father   . Heart disease Father   . Stroke Father   . Hypertension Father   . Skin cancer Father     History   Social History  . Marital Status: Married    Spouse Name: N/A    Number of Children: Y  . Years of Education: N/A   Occupational History  . pastor    Social History Main Topics  . Smoking status: Never Smoker   . Smokeless tobacco: Never Used  . Alcohol Use: No  . Drug Use: No  . Sexually Active: Yes   Other Topics Concern  . Not on file   Social History Narrative  . No narrative on file    Allergies  Allergen Reactions  . Statins     Elevated LFT's     Constitutional: Positive headache. Denies fatigue, fever or abrupt weight changes.  HEENT:  Positive sore throat. Denies eye redness ,eye pain, pressure behind the eyes, facial pain, nasal congestion ear pain, ringing in the ears, wax buildup, runny nose or bloody nose. Respiratory: Positive cough and thick yellow  sputum production. Denies difficulty breathing or shortness of breath.  Cardiovascular: Denies chest pain, chest tightness, palpitations or swelling in the hands or feet.  Psych: Pt reports anxiousness and panic attacks. Denies SI/HI.  No other specific complaints in a complete review of systems (except as listed in HPI above).  Objective:    General: Appears his stated age, well developed, well nourished in NAD. HEENT: Head: normal shape and size; Eyes: sclera white, no icterus, conjunctiva pink, PERRLA and EOMs intact; Ears: Tm's gray and intact, normal light reflex; Nose: mucosa pink and moist, septum midline; Throat/Mouth: + PND. Teeth present, mucosa pink and moist, no exudate noted, no lesions or ulcerations noted.  Neck: Mild cervical lymphadenopathy. Neck supple, trachea midline. No massses, lumps or thyromegaly present.  Cardiovascular: Normal rate and rhythm. S1,S2 noted.  No murmur, rubs or gallops noted. No JVD or BLE edema. No carotid bruits noted. Pulmonary/Chest: Normal effort and scattered ronchi troughout. No respiratory distress. No wheezes, rales or ronchi noted.  Psych: Appear very anxious, constantly moving, will not sit down.    Assessment & Plan:   Acute bronchitis, new onset with additional workup required:  Stay well hydrated and get plenty of rest eRx for Azithromycin x 5 days eRx for Hycodan cough syrup  Anxiety and Panic attacks, new onset with additional workup required:  Continue taking Prozac Will try Xanax 0.5 mg TID prn as needed for anxiety Try relaxation techniques and distraction  RTC as needed or if symptoms persist  RTC as needed or if symptoms persist.

## 2012-02-19 NOTE — Patient Instructions (Signed)
Acute Bronchitis You have acute bronchitis. This means you have a chest cold. The airways in your lungs are red and sore (inflamed). Acute means it is sudden onset.   CAUSES Bronchitis is most often caused by the same virus that causes a cold. SYMPTOMS    Body aches.   Chest congestion.   Chills.   Cough.   Fever.   Shortness of breath.   Sore throat.  TREATMENT   Acute bronchitis is usually treated with rest, fluids, and medicines for relief of fever or cough. Most symptoms should go away after a few days or a week. Increased fluids may help thin your secretions and will prevent dehydration. Your caregiver may give you an inhaler to improve your symptoms. The inhaler reduces shortness of breath and helps control cough. You can take over-the-counter pain relievers or cough medicine to decrease coughing, pain, or fever. A cool-air vaporizer may help thin bronchial secretions and make it easier to clear your chest. Antibiotics are usually not needed but can be prescribed if you smoke, are seriously ill, have chronic lung problems, are elderly, or you are at higher risk for developing complications. Allergies and asthma can make bronchitis worse. Repeated episodes of bronchitis may cause longstanding lung problems. Avoid smoking and secondhand smoke. Exposure to cigarette smoke or irritating chemicals will make bronchitis worse. If you are a cigarette smoker, consider using nicotine gum or skin patches to help control withdrawal symptoms. Quitting smoking will help your lungs heal faster. Recovery from bronchitis is often slow, but you should start feeling better after 2 to 3 days. Cough from bronchitis frequently lasts for 3 to 4 weeks. To prevent another bout of acute bronchitis:  Quit smoking.   Wash your hands frequently to get rid of viruses or use a hand sanitizer.   Avoid other people with cold or virus symptoms.   Try not to touch your hands to your mouth, nose, or eyes.  SEEK  IMMEDIATE MEDICAL CARE IF:  You develop increased fever, chills, or chest pain.   You have severe shortness of breath or bloody sputum.   You develop dehydration, fainting, repeated vomiting, or a severe headache.   You have no improvement after 1 week of treatment or you get worse.  MAKE SURE YOU:    Understand these instructions.   Will watch your condition.   Will get help right away if you are not doing well or get worse.  Document Released: 03/12/2004 Document Revised: 04/27/2011 Document Reviewed: 05/28/2010 [facility] Patient Information 2013 [place], [place].   Anxiety and Panic Attacks Your caregiver has informed you that you are having an anxiety or panic attack. There may be many forms of this. Most of the time these attacks come suddenly and without warning. They come at any time of day, including periods of sleep, and at any time of life. They may be strong and unexplained. Although panic attacks are very scary, they are physically harmless. Sometimes the cause of your anxiety is not known. Anxiety is a protective mechanism of the body in its fight or flight mechanism. Most of these perceived danger situations are actually nonphysical situations (such as anxiety over losing a job). CAUSES   The causes of an anxiety or panic attack are many. Panic attacks may occur in otherwise healthy people given a certain set of circumstances. There may be a genetic cause for panic attacks. Some medications may also have anxiety as a side effect. SYMPTOMS   Some of the most common  feelings are:  Intense terror.   Dizziness, feeling faint.   Hot and cold flashes.   Fear of going crazy.   Feelings that nothing is real.   Sweating.   Shaking.   Chest pain or a fast heartbeat (palpitations).   Smothering, choking sensations.   Feelings of impending doom and that death is near.   Tingling of extremities, this may be from over-breathing.   Altered reality (derealization).    Being detached from yourself (depersonalization).  Several symptoms can be present to make up anxiety or panic attacks. DIAGNOSIS   The evaluation by your caregiver will depend on the type of symptoms you are experiencing. The diagnosis of anxiety or panic attack is made when no physical illness can be determined to be a cause of the symptoms. TREATMENT   Treatment to prevent anxiety and panic attacks may include:  Avoidance of circumstances that cause anxiety.   Reassurance and relaxation.   Regular exercise.   Relaxation therapies, such as yoga.   Psychotherapy with a psychiatrist or therapist.   Avoidance of caffeine, alcohol and illegal drugs.   Prescribed medication.  SEEK IMMEDIATE MEDICAL CARE IF:    You experience panic attack symptoms that are different than your usual symptoms.   You have any worsening or concerning symptoms.  Document Released: 02/02/2005 Document Revised: 04/27/2011 Document Reviewed: 06/06/2009 [facility] Patient Information 2013 [place], [place].

## 2012-02-22 ENCOUNTER — Telehealth: Payer: Self-pay | Admitting: Internal Medicine

## 2012-02-22 ENCOUNTER — Other Ambulatory Visit: Payer: Self-pay | Admitting: Internal Medicine

## 2012-02-22 DIAGNOSIS — F419 Anxiety disorder, unspecified: Secondary | ICD-10-CM

## 2012-02-22 MED ORDER — FLUOXETINE HCL 10 MG PO TABS: 20.0000 mg | ORAL_TABLET | Freq: Every day | ORAL | Status: DC

## 2012-02-22 NOTE — Telephone Encounter (Signed)
Ash, Done! Rene Kocher

## 2012-02-22 NOTE — Telephone Encounter (Signed)
Pt informed rx sent to pharmacy.

## 2012-02-22 NOTE — Telephone Encounter (Signed)
Patient was seen last week for panic attacks and he is wanting to get his prozac increased from 10mg  to 20mg , he uses the [facility] on [address] in [place]

## 2012-05-04 ENCOUNTER — Other Ambulatory Visit: Payer: Self-pay | Admitting: Internal Medicine

## 2012-07-08 ENCOUNTER — Telehealth: Payer: Self-pay | Admitting: Internal Medicine

## 2012-07-08 DIAGNOSIS — I10 Essential (primary) hypertension: Secondary | ICD-10-CM

## 2012-07-08 MED ORDER — AMLODIPINE-OLMESARTAN 5-20 MG PO TABS: 1.0000 | ORAL_TABLET | Freq: Every day | ORAL | Status: DC

## 2012-07-08 NOTE — Telephone Encounter (Signed)
Pt req sample for Azor 5-20mg . Pt is out of this med and wonder if we have any sample. Pt req to wait because he came from [place]. Please advise.

## 2012-07-20 ENCOUNTER — Encounter: Payer: Self-pay | Admitting: Internal Medicine

## 2012-07-20 ENCOUNTER — Other Ambulatory Visit (INDEPENDENT_AMBULATORY_CARE_PROVIDER_SITE_OTHER): Payer: 59

## 2012-07-20 ENCOUNTER — Ambulatory Visit (INDEPENDENT_AMBULATORY_CARE_PROVIDER_SITE_OTHER): Payer: 59 | Admitting: Internal Medicine

## 2012-07-20 VITALS — BP 126/78 | HR 64 | Temp 97.8°F | Resp 16 | Wt 197.5 lb

## 2012-07-20 DIAGNOSIS — Z0000 Encounter for general adult medical examination without abnormal findings: Secondary | ICD-10-CM

## 2012-07-20 DIAGNOSIS — I10 Essential (primary) hypertension: Secondary | ICD-10-CM

## 2012-07-20 DIAGNOSIS — E7800 Pure hypercholesterolemia, unspecified: Secondary | ICD-10-CM

## 2012-07-20 LAB — COMPREHENSIVE METABOLIC PANEL
ALT: 60 U/L — ABNORMAL HIGH (ref 0–53)
Albumin: 3.9 g/dL (ref 3.5–5.2)
CO2: 25 mEq/L (ref 19–32)
Calcium: 9.4 mg/dL (ref 8.4–10.5)
Chloride: 107 mEq/L (ref 96–112)
GFR: 112.28 mL/min (ref >60.00)
Glucose, Bld: 86 mg/dL (ref 70–99)
Potassium: 4.1 mEq/L (ref 3.5–5.1)
Sodium: 141 mEq/L (ref 135–145)
Total Protein: 6.7 g/dL (ref 6.0–8.3)

## 2012-07-20 LAB — CBC WITH DIFFERENTIAL/PLATELET
Basophils Absolute: 0 10*3/uL (ref 0.0–0.1)
HCT: 44.6 % (ref 39.0–52.0)
Lymphocytes Relative: 31.6 % (ref 12.0–46.0)
Lymphs Abs: 2.2 10*3/uL (ref 0.7–4.0)
Monocytes Relative: 10.8 % (ref 3.0–12.0)
Neutrophils Relative %: 55 % (ref 43.0–77.0)
Platelets: 189 10*3/uL (ref 150.0–400.0)
RDW: 13.1 % (ref 11.5–14.6)

## 2012-07-20 LAB — LIPID PANEL: Total CHOL/HDL Ratio: 6

## 2012-07-20 LAB — FECAL OCCULT BLOOD, GUAIAC: Fecal Occult Blood: NEGATIVE

## 2012-07-20 LAB — TSH: TSH: 1.53 u[IU]/mL (ref 0.35–5.50)

## 2012-07-20 LAB — LDL CHOLESTEROL, DIRECT: Direct LDL: 137.5 mg/dL

## 2012-07-20 NOTE — Progress Notes (Signed)
Subjective:    Patient ID: Gregory Rivers, male    DOB: 08-08-1965, 47 y.o.   MRN: [id number]  Hypertension This is a chronic problem. The current episode started more than 1 year ago. The problem is unchanged. The problem is controlled. Pertinent negatives include no anxiety, blurred vision, chest pain, headaches, malaise/fatigue, neck pain, orthopnea, palpitations, peripheral edema, PND, shortness of breath or sweats. Past treatments include calcium channel blockers and angiotensin blockers. The current treatment provides significant improvement. There are no compliance problems.       Review of Systems  Constitutional: Positive for unexpected weight change (some weight gain). Negative for fever, chills, malaise/fatigue, diaphoresis, activity change, appetite change and fatigue.  HENT: Negative.  Negative for neck pain.   Eyes: Negative.  Negative for blurred vision.  Respiratory: Negative.  Negative for cough, chest tightness, shortness of breath, wheezing and stridor.   Cardiovascular: Negative.  Negative for chest pain, palpitations, orthopnea, leg swelling and PND.  Gastrointestinal: Negative.  Negative for nausea, vomiting, abdominal pain, diarrhea and constipation.  Endocrine: Negative.   Genitourinary: Negative.   Musculoskeletal: Negative.  Negative for myalgias, back pain, joint swelling, arthralgias and gait problem.  Skin: Negative.   Allergic/Immunologic: Negative.   Neurological: Negative.  Negative for headaches.  Hematological: Negative.   Psychiatric/Behavioral: Negative.        Objective:   Physical Exam  Vitals reviewed. Constitutional: He is oriented to person, place, and time. He appears well-developed and well-nourished. No distress.  HENT:  Head: Normocephalic and atraumatic.  Mouth/Throat: Oropharynx is clear and moist. No oropharyngeal exudate.  Eyes: Conjunctivae are normal. Right eye exhibits no discharge. Left eye exhibits no discharge. No scleral  icterus.  Neck: Normal range of motion. Neck supple. No JVD present. No tracheal deviation present. No thyromegaly present.  Cardiovascular: Normal rate, regular rhythm, normal heart sounds and intact distal pulses.  Exam reveals no gallop and no friction rub.   No murmur heard. Pulmonary/Chest: Effort normal and breath sounds normal. No stridor. No respiratory distress. He has no wheezes. He has no rales. He exhibits no tenderness.  Abdominal: Soft. Bowel sounds are normal. He exhibits no distension and no mass. There is no tenderness. There is no rebound and no guarding. Hernia confirmed negative in the right inguinal area and confirmed negative in the left inguinal area.  Genitourinary: Rectum normal, prostate normal, testes normal and penis normal. Rectal exam shows no external hemorrhoid, no internal hemorrhoid, no fissure, no mass, no tenderness and anal tone normal. Guaiac negative stool. Prostate is not enlarged and not tender. Right testis shows no mass, no swelling and no tenderness. Right testis is descended. Left testis shows no mass, no swelling and no tenderness. Left testis is descended. Circumcised. No paraphimosis, penile erythema or penile tenderness. No discharge found.  Musculoskeletal: Normal range of motion. He exhibits no edema and no tenderness.  Lymphadenopathy:    He has no cervical adenopathy.       Right: No inguinal adenopathy present.       Left: No inguinal adenopathy present.  Neurological: He is oriented to person, place, and time.  Skin: Skin is warm and dry. No rash noted. He is not diaphoretic. No erythema. No pallor.  Psychiatric: He has a normal mood and affect. His behavior is normal. Judgment and thought content normal.      Lab Results  Component Value Date   WBC 8.6 04/24/2011   HGB 16.3 04/24/2011   HCT 48.3 04/24/2011   PLT 212.0  04/24/2011   GLUCOSE 78 04/24/2011   CHOL 222* 04/24/2011   TRIG 260.0* 04/24/2011   HDL 37.40* 04/24/2011   LDLDIRECT 156.8  04/24/2011   ALT 56* 04/24/2011   AST 35 04/24/2011   NA 139 04/24/2011   K 4.1 04/24/2011   CL 102 04/24/2011   CREATININE 1.3 04/24/2011   BUN 18 04/24/2011   CO2 28 04/24/2011   TSH 1.95 04/24/2011   PSA 0.43 04/24/2011      Assessment & Plan:

## 2012-07-20 NOTE — Assessment & Plan Note (Signed)
His BP is well controlled Today I will check his lytes and renal function 

## 2012-07-20 NOTE — Assessment & Plan Note (Signed)
Exam done Labs ordered Vaccines were reviewed Pt ed material was given 

## 2012-07-20 NOTE — Patient Instructions (Signed)
Health Maintenance, Males A healthy lifestyle and preventative care can promote health and wellness.  Maintain regular health, dental, and eye exams.  Eat a healthy diet. Foods like vegetables, fruits, whole grains, low-fat dairy products, and lean protein foods contain the nutrients you need without too many calories. Decrease your intake of foods high in solid fats, added sugars, and salt. Get information about a proper diet from your caregiver, if necessary.  Regular physical exercise is one of the most important things you can do for your health. Most adults should get at least 150 minutes of moderate-intensity exercise (any activity that increases your heart rate and causes you to sweat) each week. In addition, most adults need muscle-strengthening exercises on 2 or more days a week.   Maintain a healthy weight. The body mass index (BMI) is a screening tool to identify possible weight problems. It provides an estimate of body fat based on height and weight. Your caregiver can help determine your BMI, and can help you achieve or maintain a healthy weight. For adults 20 years and older:  A BMI below 18.5 is considered underweight.  A BMI of 18.5 to 24.9 is normal.  A BMI of 25 to 29.9 is considered overweight.  A BMI of 30 and above is considered obese.  Maintain normal blood lipids and cholesterol by exercising and minimizing your intake of saturated fat. Eat a balanced diet with plenty of fruits and vegetables. Blood tests for lipids and cholesterol should begin at age 20 and be repeated every 5 years. If your lipid or cholesterol levels are high, you are over 50, or you are a high risk for heart disease, you may need your cholesterol levels checked more frequently.Ongoing high lipid and cholesterol levels should be treated with medicines, if diet and exercise are not effective.  If you smoke, find out from your caregiver how to quit. If you do not use tobacco, do not start.  If you  choose to drink alcohol, do not exceed 2 drinks per day. One drink is considered to be 12 ounces (355 mL) of beer, 5 ounces (148 mL) of wine, or 1.5 ounces (44 mL) of liquor.  Avoid use of street drugs. Do not share needles with anyone. Ask for help if you need support or instructions about stopping the use of drugs.  High blood pressure causes heart disease and increases the risk of stroke. Blood pressure should be checked at least every 1 to 2 years. Ongoing high blood pressure should be treated with medicines if weight loss and exercise are not effective.  If you are 45 to 46 years old, ask your caregiver if you should take aspirin to prevent heart disease.  Diabetes screening involves taking a blood sample to check your fasting blood sugar level. This should be done once every 3 years, after age 45, if you are within normal weight and without risk factors for diabetes. Testing should be considered at a younger age or be carried out more frequently if you are overweight and have at least 1 risk factor for diabetes.  Colorectal cancer can be detected and often prevented. Most routine colorectal cancer screening begins at the age of 50 and continues through age 75. However, your caregiver may recommend screening at an earlier age if you have risk factors for colon cancer. On a yearly basis, your caregiver may provide home test kits to check for hidden blood in the stool. Use of a small camera at the end of a tube,   to directly examine the colon (sigmoidoscopy or colonoscopy), can detect the earliest forms of colorectal cancer. Talk to your caregiver about this at age 50, when routine screening begins. Direct examination of the colon should be repeated every 5 to 10 years through age 75, unless early forms of pre-cancerous polyps or small growths are found.  Hepatitis C blood testing is recommended for all people born from 1945 through 1965 and any individual with known risks for hepatitis C.  Healthy  men should no longer receive prostate-specific antigen (PSA) blood tests as part of routine cancer screening. Consult with your caregiver about prostate cancer screening.  Testicular cancer screening is not recommended for adolescents or adult males who have no symptoms. Screening includes self-exam, caregiver exam, and other screening tests. Consult with your caregiver about any symptoms you have or any concerns you have about testicular cancer.  Practice safe sex. Use condoms and avoid high-risk sexual practices to reduce the spread of sexually transmitted infections (STIs).  Use sunscreen with a sun protection factor (SPF) of 30 or greater. Apply sunscreen liberally and repeatedly throughout the day. You should seek shade when your shadow is shorter than you. Protect yourself by wearing long sleeves, pants, a wide-brimmed hat, and sunglasses year round, whenever you are outdoors.  Notify your caregiver of new moles or changes in moles, especially if there is a change in shape or color. Also notify your caregiver if a mole is larger than the size of a pencil eraser.  A one-time screening for abdominal aortic aneurysm (AAA) and surgical repair of large AAAs by sound wave imaging (ultrasonography) is recommended for ages 65 to 75 years who are current or former smokers.  Stay current with your immunizations. Document Released: 08/01/2007 Document Revised: 04/27/2011 Document Reviewed: 06/30/2010 [employer] Patient Information 2014 [employer].  

## 2012-07-20 NOTE — Assessment & Plan Note (Signed)
FLP today 

## 2012-09-02 ENCOUNTER — Other Ambulatory Visit: Payer: Self-pay | Admitting: Internal Medicine

## 2012-09-15 ENCOUNTER — Telehealth: Payer: Self-pay | Admitting: Internal Medicine

## 2012-09-15 DIAGNOSIS — I10 Essential (primary) hypertension: Secondary | ICD-10-CM

## 2012-09-15 MED ORDER — AMLODIPINE-OLMESARTAN 5-20 MG PO TABS: 1.0000 | ORAL_TABLET | Freq: Every day | ORAL | Status: DC

## 2012-09-15 NOTE — Telephone Encounter (Signed)
No samples available in the office, Rx sent to pharmacy as requested. Pt notified

## 2012-09-15 NOTE — Telephone Encounter (Signed)
Patient states that Dr. Yetta Barre has been giving him Azor samples.  He needs more samples or RX called to pharmacy.  Please advise patient if more samples are available.

## 2012-09-20 ENCOUNTER — Telehealth: Payer: Self-pay | Admitting: *Deleted

## 2012-09-20 NOTE — Telephone Encounter (Signed)
Prior auth for [employer]  has been approved through 8.4.15.  Pharmacy made aware.

## 2012-12-22 ENCOUNTER — Other Ambulatory Visit: Payer: Self-pay

## 2013-03-16 ENCOUNTER — Other Ambulatory Visit: Payer: Self-pay | Admitting: Internal Medicine

## 2013-03-17 ENCOUNTER — Other Ambulatory Visit: Payer: Self-pay | Admitting: Internal Medicine

## 2013-06-06 ENCOUNTER — Other Ambulatory Visit: Payer: Self-pay | Admitting: Internal Medicine

## 2014-03-11 ENCOUNTER — Other Ambulatory Visit: Payer: Self-pay | Admitting: Internal Medicine

## 2014-06-08 ENCOUNTER — Telehealth: Payer: Self-pay | Admitting: Pulmonary Disease

## 2014-06-08 DIAGNOSIS — G4733 Obstructive sleep apnea (adult) (pediatric): Secondary | ICD-10-CM

## 2014-06-08 NOTE — Telephone Encounter (Signed)
Rivers trying to establish supplier for CPAP supplies.  Wants order sent to Gregory Rivers.  Will need to send Sleep Study done 06/23/2011.  Order entered.  Rivers notified that order has been put in for CPAP supplies. Nothing further needed.

## 2014-06-08 NOTE — Telephone Encounter (Signed)
lmtcb x1 

## 2014-06-08 NOTE — Telephone Encounter (Signed)
[phone] calling back

## 2016-02-26 DIAGNOSIS — J069 Acute upper respiratory infection, unspecified: Secondary | ICD-10-CM | POA: Diagnosis not present

## 2016-02-26 DIAGNOSIS — K7581 Nonalcoholic steatohepatitis (NASH): Secondary | ICD-10-CM | POA: Diagnosis not present

## 2016-03-11 DIAGNOSIS — R748 Abnormal levels of other serum enzymes: Secondary | ICD-10-CM | POA: Diagnosis not present

## 2016-03-11 DIAGNOSIS — Z1389 Encounter for screening for other disorder: Secondary | ICD-10-CM | POA: Diagnosis not present

## 2016-03-11 DIAGNOSIS — Z0000 Encounter for general adult medical examination without abnormal findings: Secondary | ICD-10-CM | POA: Diagnosis not present

## 2016-03-11 DIAGNOSIS — R0602 Shortness of breath: Secondary | ICD-10-CM | POA: Diagnosis not present

## 2016-03-19 DIAGNOSIS — R0989 Other specified symptoms and signs involving the circulatory and respiratory systems: Secondary | ICD-10-CM | POA: Diagnosis not present

## 2016-03-19 DIAGNOSIS — K219 Gastro-esophageal reflux disease without esophagitis: Secondary | ICD-10-CM | POA: Diagnosis not present

## 2016-03-19 DIAGNOSIS — G4733 Obstructive sleep apnea (adult) (pediatric): Secondary | ICD-10-CM | POA: Diagnosis not present

## 2016-03-19 DIAGNOSIS — B370 Candidal stomatitis: Secondary | ICD-10-CM | POA: Diagnosis not present

## 2016-03-26 DIAGNOSIS — G4710 Hypersomnia, unspecified: Secondary | ICD-10-CM | POA: Diagnosis not present

## 2016-04-08 DIAGNOSIS — G4700 Insomnia, unspecified: Secondary | ICD-10-CM | POA: Diagnosis not present

## 2016-04-08 DIAGNOSIS — Z79899 Other long term (current) drug therapy: Secondary | ICD-10-CM | POA: Diagnosis not present

## 2016-05-05 DIAGNOSIS — K219 Gastro-esophageal reflux disease without esophagitis: Secondary | ICD-10-CM | POA: Diagnosis not present

## 2016-05-05 DIAGNOSIS — R0989 Other specified symptoms and signs involving the circulatory and respiratory systems: Secondary | ICD-10-CM | POA: Diagnosis not present

## 2016-05-12 DIAGNOSIS — I10 Essential (primary) hypertension: Secondary | ICD-10-CM | POA: Diagnosis not present

## 2016-06-04 DIAGNOSIS — G4733 Obstructive sleep apnea (adult) (pediatric): Secondary | ICD-10-CM | POA: Diagnosis not present

## 2016-06-04 DIAGNOSIS — R5383 Other fatigue: Secondary | ICD-10-CM | POA: Diagnosis not present

## 2016-06-09 DIAGNOSIS — Z79899 Other long term (current) drug therapy: Secondary | ICD-10-CM | POA: Diagnosis not present

## 2016-08-27 DIAGNOSIS — G4733 Obstructive sleep apnea (adult) (pediatric): Secondary | ICD-10-CM | POA: Diagnosis not present

## 2016-09-23 DIAGNOSIS — R5383 Other fatigue: Secondary | ICD-10-CM | POA: Diagnosis not present

## 2016-09-23 DIAGNOSIS — G4733 Obstructive sleep apnea (adult) (pediatric): Secondary | ICD-10-CM | POA: Diagnosis not present

## 2016-10-06 DIAGNOSIS — Z125 Encounter for screening for malignant neoplasm of prostate: Secondary | ICD-10-CM | POA: Diagnosis not present

## 2016-10-06 DIAGNOSIS — Z1211 Encounter for screening for malignant neoplasm of colon: Secondary | ICD-10-CM | POA: Diagnosis not present

## 2016-10-06 DIAGNOSIS — R739 Hyperglycemia, unspecified: Secondary | ICD-10-CM | POA: Diagnosis not present

## 2016-10-06 DIAGNOSIS — Z1389 Encounter for screening for other disorder: Secondary | ICD-10-CM | POA: Diagnosis not present

## 2016-10-06 DIAGNOSIS — Z0000 Encounter for general adult medical examination without abnormal findings: Secondary | ICD-10-CM | POA: Diagnosis not present

## 2016-10-06 DIAGNOSIS — I10 Essential (primary) hypertension: Secondary | ICD-10-CM | POA: Diagnosis not present

## 2016-10-06 DIAGNOSIS — Z79899 Other long term (current) drug therapy: Secondary | ICD-10-CM | POA: Diagnosis not present

## 2016-10-09 DIAGNOSIS — G4733 Obstructive sleep apnea (adult) (pediatric): Secondary | ICD-10-CM | POA: Diagnosis not present

## 2016-10-14 DIAGNOSIS — K219 Gastro-esophageal reflux disease without esophagitis: Secondary | ICD-10-CM | POA: Diagnosis not present

## 2016-10-30 DIAGNOSIS — K635 Polyp of colon: Secondary | ICD-10-CM | POA: Diagnosis not present

## 2016-10-30 DIAGNOSIS — K5730 Diverticulosis of large intestine without perforation or abscess without bleeding: Secondary | ICD-10-CM | POA: Diagnosis not present

## 2016-10-30 DIAGNOSIS — Z1211 Encounter for screening for malignant neoplasm of colon: Secondary | ICD-10-CM | POA: Diagnosis not present

## 2016-10-30 DIAGNOSIS — D122 Benign neoplasm of ascending colon: Secondary | ICD-10-CM | POA: Diagnosis not present

## 2016-11-15 DIAGNOSIS — G4733 Obstructive sleep apnea (adult) (pediatric): Secondary | ICD-10-CM | POA: Diagnosis not present

## 2016-11-24 DIAGNOSIS — G4733 Obstructive sleep apnea (adult) (pediatric): Secondary | ICD-10-CM | POA: Diagnosis not present

## 2016-11-25 DIAGNOSIS — R5383 Other fatigue: Secondary | ICD-10-CM | POA: Diagnosis not present

## 2016-11-25 DIAGNOSIS — G4733 Obstructive sleep apnea (adult) (pediatric): Secondary | ICD-10-CM | POA: Diagnosis not present

## 2016-12-15 DIAGNOSIS — G4733 Obstructive sleep apnea (adult) (pediatric): Secondary | ICD-10-CM | POA: Diagnosis not present

## 2017-01-15 DIAGNOSIS — G4733 Obstructive sleep apnea (adult) (pediatric): Secondary | ICD-10-CM | POA: Diagnosis not present

## 2017-02-14 DIAGNOSIS — G4733 Obstructive sleep apnea (adult) (pediatric): Secondary | ICD-10-CM | POA: Diagnosis not present

## 2017-02-19 DIAGNOSIS — G4733 Obstructive sleep apnea (adult) (pediatric): Secondary | ICD-10-CM | POA: Diagnosis not present

## 2017-03-17 DIAGNOSIS — G4733 Obstructive sleep apnea (adult) (pediatric): Secondary | ICD-10-CM | POA: Diagnosis not present

## 2017-04-07 DIAGNOSIS — E785 Hyperlipidemia, unspecified: Secondary | ICD-10-CM | POA: Diagnosis not present

## 2017-04-07 DIAGNOSIS — I10 Essential (primary) hypertension: Secondary | ICD-10-CM | POA: Diagnosis not present

## 2017-04-15 DIAGNOSIS — G4733 Obstructive sleep apnea (adult) (pediatric): Secondary | ICD-10-CM | POA: Diagnosis not present

## 2017-05-06 DIAGNOSIS — J069 Acute upper respiratory infection, unspecified: Secondary | ICD-10-CM | POA: Diagnosis not present

## 2017-05-15 DIAGNOSIS — G4733 Obstructive sleep apnea (adult) (pediatric): Secondary | ICD-10-CM | POA: Diagnosis not present

## 2017-05-25 DIAGNOSIS — G4733 Obstructive sleep apnea (adult) (pediatric): Secondary | ICD-10-CM | POA: Diagnosis not present

## 2017-06-15 DIAGNOSIS — G4733 Obstructive sleep apnea (adult) (pediatric): Secondary | ICD-10-CM | POA: Diagnosis not present

## 2017-07-02 DIAGNOSIS — G4733 Obstructive sleep apnea (adult) (pediatric): Secondary | ICD-10-CM | POA: Diagnosis not present

## 2017-07-13 DIAGNOSIS — M109 Gout, unspecified: Secondary | ICD-10-CM | POA: Diagnosis not present

## 2017-07-15 DIAGNOSIS — G4733 Obstructive sleep apnea (adult) (pediatric): Secondary | ICD-10-CM | POA: Diagnosis not present

## 2017-08-15 DIAGNOSIS — G4733 Obstructive sleep apnea (adult) (pediatric): Secondary | ICD-10-CM | POA: Diagnosis not present

## 2017-10-11 DIAGNOSIS — S46212A Strain of muscle, fascia and tendon of other parts of biceps, left arm, initial encounter: Secondary | ICD-10-CM | POA: Diagnosis not present

## 2017-10-12 DIAGNOSIS — L918 Other hypertrophic disorders of the skin: Secondary | ICD-10-CM | POA: Diagnosis not present

## 2017-10-12 DIAGNOSIS — D225 Melanocytic nevi of trunk: Secondary | ICD-10-CM | POA: Diagnosis not present

## 2017-10-12 DIAGNOSIS — L820 Inflamed seborrheic keratosis: Secondary | ICD-10-CM | POA: Diagnosis not present

## 2017-10-12 DIAGNOSIS — L821 Other seborrheic keratosis: Secondary | ICD-10-CM | POA: Diagnosis not present

## 2017-10-13 DIAGNOSIS — M25622 Stiffness of left elbow, not elsewhere classified: Secondary | ICD-10-CM | POA: Diagnosis not present

## 2017-10-13 DIAGNOSIS — M25522 Pain in left elbow: Secondary | ICD-10-CM | POA: Diagnosis not present

## 2017-10-13 DIAGNOSIS — M25422 Effusion, left elbow: Secondary | ICD-10-CM | POA: Diagnosis not present

## 2017-10-20 DIAGNOSIS — M25622 Stiffness of left elbow, not elsewhere classified: Secondary | ICD-10-CM | POA: Diagnosis not present

## 2017-10-20 DIAGNOSIS — M25422 Effusion, left elbow: Secondary | ICD-10-CM | POA: Diagnosis not present

## 2017-10-20 DIAGNOSIS — M25522 Pain in left elbow: Secondary | ICD-10-CM | POA: Diagnosis not present

## 2017-10-27 DIAGNOSIS — M25622 Stiffness of left elbow, not elsewhere classified: Secondary | ICD-10-CM | POA: Diagnosis not present

## 2017-10-27 DIAGNOSIS — M25422 Effusion, left elbow: Secondary | ICD-10-CM | POA: Diagnosis not present

## 2017-10-27 DIAGNOSIS — M25522 Pain in left elbow: Secondary | ICD-10-CM | POA: Diagnosis not present

## 2017-12-16 DIAGNOSIS — M9903 Segmental and somatic dysfunction of lumbar region: Secondary | ICD-10-CM | POA: Diagnosis not present

## 2017-12-16 DIAGNOSIS — M9905 Segmental and somatic dysfunction of pelvic region: Secondary | ICD-10-CM | POA: Diagnosis not present

## 2017-12-16 DIAGNOSIS — M5416 Radiculopathy, lumbar region: Secondary | ICD-10-CM | POA: Diagnosis not present

## 2017-12-28 DIAGNOSIS — M9903 Segmental and somatic dysfunction of lumbar region: Secondary | ICD-10-CM | POA: Diagnosis not present

## 2017-12-28 DIAGNOSIS — M5416 Radiculopathy, lumbar region: Secondary | ICD-10-CM | POA: Diagnosis not present

## 2017-12-28 DIAGNOSIS — M9905 Segmental and somatic dysfunction of pelvic region: Secondary | ICD-10-CM | POA: Diagnosis not present

## 2018-03-18 DIAGNOSIS — Z0000 Encounter for general adult medical examination without abnormal findings: Secondary | ICD-10-CM | POA: Diagnosis not present

## 2018-03-18 DIAGNOSIS — Z79899 Other long term (current) drug therapy: Secondary | ICD-10-CM | POA: Diagnosis not present

## 2018-03-18 DIAGNOSIS — I10 Essential (primary) hypertension: Secondary | ICD-10-CM | POA: Diagnosis not present

## 2018-04-27 DIAGNOSIS — R509 Fever, unspecified: Secondary | ICD-10-CM | POA: Diagnosis not present

## 2018-04-27 DIAGNOSIS — R05 Cough: Secondary | ICD-10-CM | POA: Diagnosis not present

## 2018-10-25 DIAGNOSIS — G4733 Obstructive sleep apnea (adult) (pediatric): Secondary | ICD-10-CM | POA: Diagnosis not present

## 2018-10-25 DIAGNOSIS — K219 Gastro-esophageal reflux disease without esophagitis: Secondary | ICD-10-CM | POA: Diagnosis not present

## 2018-10-25 DIAGNOSIS — F419 Anxiety disorder, unspecified: Secondary | ICD-10-CM | POA: Diagnosis not present

## 2018-10-25 DIAGNOSIS — K1379 Other lesions of oral mucosa: Secondary | ICD-10-CM | POA: Diagnosis not present

## 2018-12-06 DIAGNOSIS — K219 Gastro-esophageal reflux disease without esophagitis: Secondary | ICD-10-CM | POA: Diagnosis not present

## 2018-12-06 DIAGNOSIS — H938X3 Other specified disorders of ear, bilateral: Secondary | ICD-10-CM | POA: Diagnosis not present

## 2018-12-06 DIAGNOSIS — M950 Acquired deformity of nose: Secondary | ICD-10-CM | POA: Diagnosis not present

## 2018-12-06 DIAGNOSIS — Z7289 Other problems related to lifestyle: Secondary | ICD-10-CM | POA: Diagnosis not present

## 2018-12-07 ENCOUNTER — Other Ambulatory Visit: Payer: Self-pay | Admitting: Otolaryngology

## 2018-12-07 DIAGNOSIS — R1314 Dysphagia, pharyngoesophageal phase: Secondary | ICD-10-CM

## 2019-01-02 ENCOUNTER — Ambulatory Visit
Admission: RE | Admit: 2019-01-02 | Discharge: 2019-01-02 | Disposition: A | Discharge status: Home / Self Care | Payer: BC Managed Care – PPO | Source: Ambulatory Visit | Attending: Otolaryngology | Admitting: Otolaryngology

## 2019-01-02 DIAGNOSIS — R1314 Dysphagia, pharyngoesophageal phase: Secondary | ICD-10-CM

## 2019-01-02 DIAGNOSIS — K224 Dyskinesia of esophagus: Secondary | ICD-10-CM | POA: Diagnosis not present

## 2019-01-06 DIAGNOSIS — R945 Abnormal results of liver function studies: Secondary | ICD-10-CM | POA: Diagnosis not present

## 2019-01-06 DIAGNOSIS — E785 Hyperlipidemia, unspecified: Secondary | ICD-10-CM | POA: Diagnosis not present

## 2019-05-09 DIAGNOSIS — H52223 Regular astigmatism, bilateral: Secondary | ICD-10-CM | POA: Diagnosis not present

## 2019-06-15 DIAGNOSIS — E782 Mixed hyperlipidemia: Secondary | ICD-10-CM | POA: Diagnosis not present

## 2019-06-15 DIAGNOSIS — I119 Hypertensive heart disease without heart failure: Secondary | ICD-10-CM | POA: Diagnosis not present

## 2019-06-15 DIAGNOSIS — F329 Major depressive disorder, single episode, unspecified: Secondary | ICD-10-CM | POA: Diagnosis not present

## 2019-06-15 DIAGNOSIS — Z8042 Family history of malignant neoplasm of prostate: Secondary | ICD-10-CM | POA: Diagnosis not present

## 2019-06-15 DIAGNOSIS — F419 Anxiety disorder, unspecified: Secondary | ICD-10-CM | POA: Diagnosis not present

## 2019-07-04 DIAGNOSIS — M545 Low back pain: Secondary | ICD-10-CM | POA: Diagnosis not present

## 2019-07-04 DIAGNOSIS — M9905 Segmental and somatic dysfunction of pelvic region: Secondary | ICD-10-CM | POA: Diagnosis not present

## 2019-07-04 DIAGNOSIS — M9902 Segmental and somatic dysfunction of thoracic region: Secondary | ICD-10-CM | POA: Diagnosis not present

## 2019-07-04 DIAGNOSIS — M9903 Segmental and somatic dysfunction of lumbar region: Secondary | ICD-10-CM | POA: Diagnosis not present

## 2019-08-03 DIAGNOSIS — L918 Other hypertrophic disorders of the skin: Secondary | ICD-10-CM | POA: Diagnosis not present

## 2019-08-03 DIAGNOSIS — L820 Inflamed seborrheic keratosis: Secondary | ICD-10-CM | POA: Diagnosis not present

## 2019-08-03 DIAGNOSIS — D1801 Hemangioma of skin and subcutaneous tissue: Secondary | ICD-10-CM | POA: Diagnosis not present

## 2019-08-03 DIAGNOSIS — D485 Neoplasm of uncertain behavior of skin: Secondary | ICD-10-CM | POA: Diagnosis not present

## 2019-08-03 DIAGNOSIS — D2239 Melanocytic nevi of other parts of face: Secondary | ICD-10-CM | POA: Diagnosis not present

## 2019-08-03 DIAGNOSIS — D225 Melanocytic nevi of trunk: Secondary | ICD-10-CM | POA: Diagnosis not present

## 2019-09-20 DIAGNOSIS — M545 Low back pain: Secondary | ICD-10-CM | POA: Diagnosis not present

## 2019-10-12 DIAGNOSIS — M4316 Spondylolisthesis, lumbar region: Secondary | ICD-10-CM | POA: Diagnosis not present

## 2019-10-12 DIAGNOSIS — M545 Low back pain: Secondary | ICD-10-CM | POA: Diagnosis not present

## 2019-10-20 DIAGNOSIS — M6281 Muscle weakness (generalized): Secondary | ICD-10-CM | POA: Diagnosis not present

## 2019-10-20 DIAGNOSIS — M545 Low back pain: Secondary | ICD-10-CM | POA: Diagnosis not present

## 2019-10-25 DIAGNOSIS — M545 Low back pain: Secondary | ICD-10-CM | POA: Diagnosis not present

## 2019-10-25 DIAGNOSIS — M6281 Muscle weakness (generalized): Secondary | ICD-10-CM | POA: Diagnosis not present

## 2019-11-09 DIAGNOSIS — M545 Low back pain: Secondary | ICD-10-CM | POA: Diagnosis not present

## 2019-11-09 DIAGNOSIS — M4316 Spondylolisthesis, lumbar region: Secondary | ICD-10-CM | POA: Diagnosis not present

## 2020-01-02 DIAGNOSIS — E782 Mixed hyperlipidemia: Secondary | ICD-10-CM | POA: Diagnosis not present

## 2020-01-02 DIAGNOSIS — Z23 Encounter for immunization: Secondary | ICD-10-CM | POA: Diagnosis not present

## 2020-01-02 DIAGNOSIS — R7309 Other abnormal glucose: Secondary | ICD-10-CM | POA: Diagnosis not present

## 2020-01-02 DIAGNOSIS — I119 Hypertensive heart disease without heart failure: Secondary | ICD-10-CM | POA: Diagnosis not present

## 2020-01-02 DIAGNOSIS — R5383 Other fatigue: Secondary | ICD-10-CM | POA: Diagnosis not present

## 2020-01-02 DIAGNOSIS — R5381 Other malaise: Secondary | ICD-10-CM | POA: Diagnosis not present

## 2020-03-22 DIAGNOSIS — Z20828 Contact with and (suspected) exposure to other viral communicable diseases: Secondary | ICD-10-CM | POA: Diagnosis not present

## 2020-03-22 DIAGNOSIS — Z20822 Contact with and (suspected) exposure to covid-19: Secondary | ICD-10-CM | POA: Diagnosis not present

## 2020-08-08 DIAGNOSIS — D225 Melanocytic nevi of trunk: Secondary | ICD-10-CM | POA: Diagnosis not present

## 2020-08-08 DIAGNOSIS — D2239 Melanocytic nevi of other parts of face: Secondary | ICD-10-CM | POA: Diagnosis not present

## 2020-08-08 DIAGNOSIS — L820 Inflamed seborrheic keratosis: Secondary | ICD-10-CM | POA: Diagnosis not present

## 2020-08-08 DIAGNOSIS — L814 Other melanin hyperpigmentation: Secondary | ICD-10-CM | POA: Diagnosis not present

## 2020-08-08 DIAGNOSIS — L918 Other hypertrophic disorders of the skin: Secondary | ICD-10-CM | POA: Diagnosis not present

## 2020-11-04 DIAGNOSIS — R7309 Other abnormal glucose: Secondary | ICD-10-CM | POA: Diagnosis not present

## 2020-11-04 DIAGNOSIS — E782 Mixed hyperlipidemia: Secondary | ICD-10-CM | POA: Diagnosis not present

## 2020-11-04 DIAGNOSIS — I119 Hypertensive heart disease without heart failure: Secondary | ICD-10-CM | POA: Diagnosis not present

## 2020-11-04 DIAGNOSIS — E669 Obesity, unspecified: Secondary | ICD-10-CM | POA: Diagnosis not present

## 2020-11-05 DIAGNOSIS — R7309 Other abnormal glucose: Secondary | ICD-10-CM | POA: Diagnosis not present

## 2020-11-05 DIAGNOSIS — I119 Hypertensive heart disease without heart failure: Secondary | ICD-10-CM | POA: Diagnosis not present

## 2020-11-05 DIAGNOSIS — E782 Mixed hyperlipidemia: Secondary | ICD-10-CM | POA: Diagnosis not present

## 2021-03-07 IMAGING — RF DG ESOPHAGUS
10 series · 14 of 24 positions shown · non-contrast
Comparison: 05/28/2014 CT abdomen/pelvis.

CLINICAL DATA: Chronic globus sensation in the throat.
Pharyngoesophageal dysphagia.

EXAM:
ESOPHOGRAM / BARIUM SWALLOW / BARIUM TABLET STUDY
TECHNIQUE: Combined double contrast and single contrast examination performed
using effervescent crystals, thick barium liquid, and thin barium
liquid. The patient was observed with fluoroscopy swallowing a 13 mm
barium sulphate tablet.
FLUOROSCOPY TIME:  Fluoroscopy Time:  2 minutes 36 seconds
Radiation Exposure Index (if provided by the fluoroscopic device):
226 mGy
Number of Acquired Spot Images: 7

[Series 1: sequence · 1 of 16 frames shown (1 of 6)]
[frame 3/16]
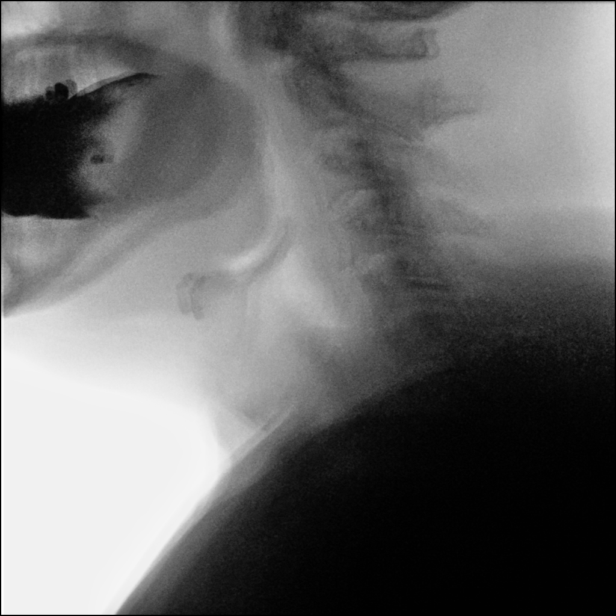

[Series 2: one shot · 0.15mm/px · 1 of 1 slices shown (1 of 4)]
[im 1/1]
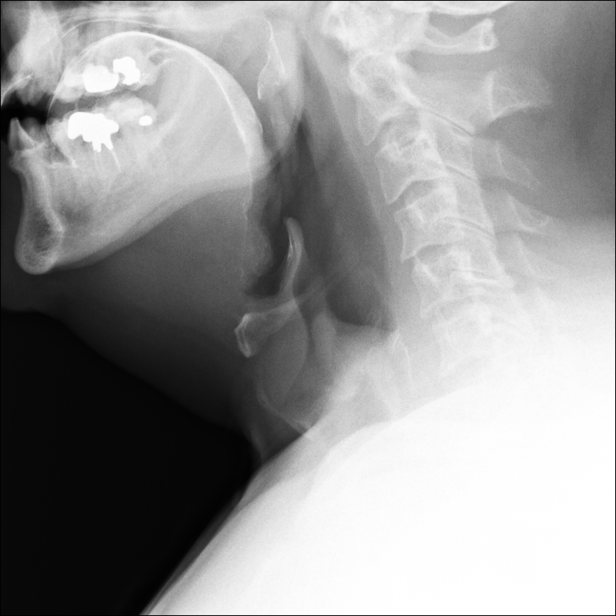

[Series 3: sequence · 1 of 12 frames shown (2 of 6)]
[frame 5/12]
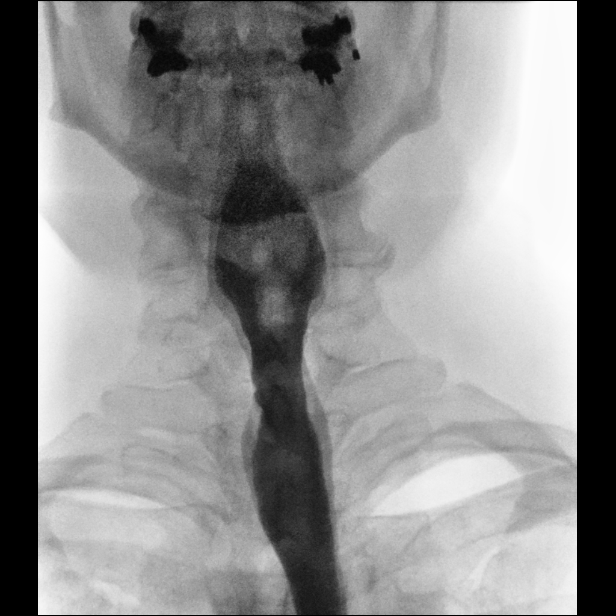

[Series 4: one shot · 0.15mm/px · 3 of 6 slices shown (2 of 4)]
[im 1/6]
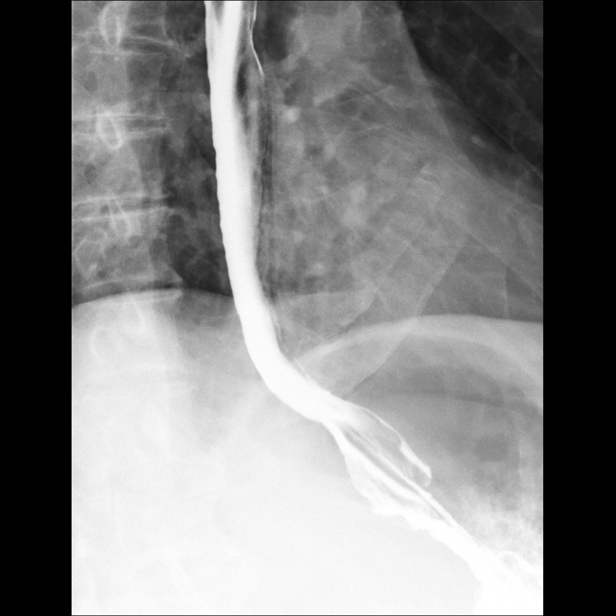
[im 2/6]
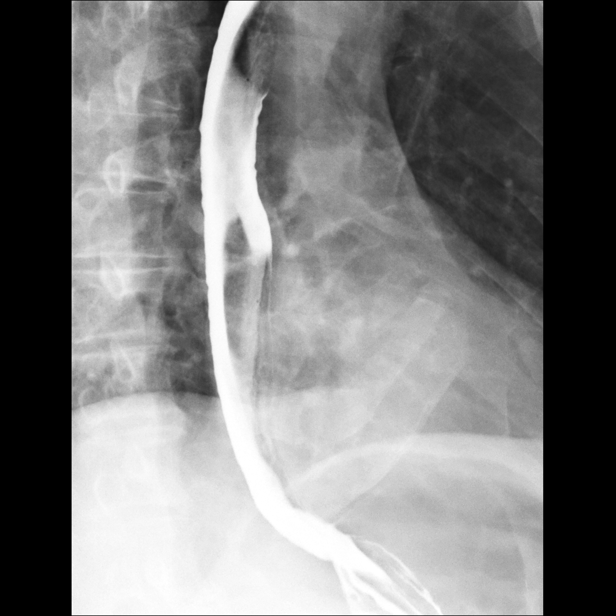
[im 5/6]
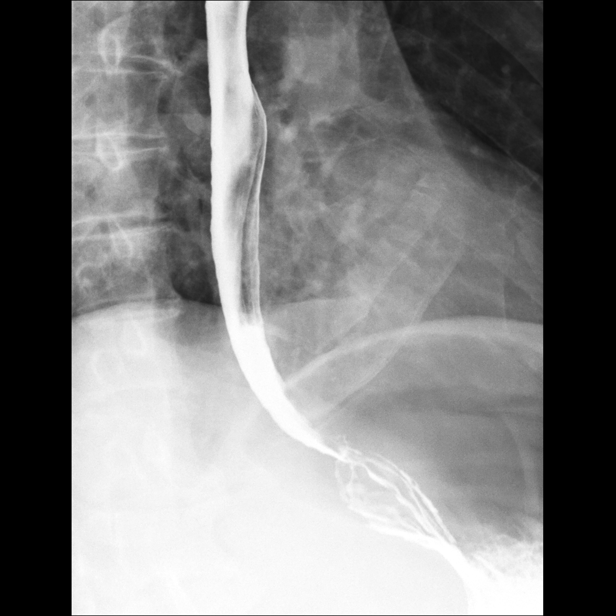

[Series 5: sequence · 2 of 40 frames shown (3 of 6)]
[frame 7/40]
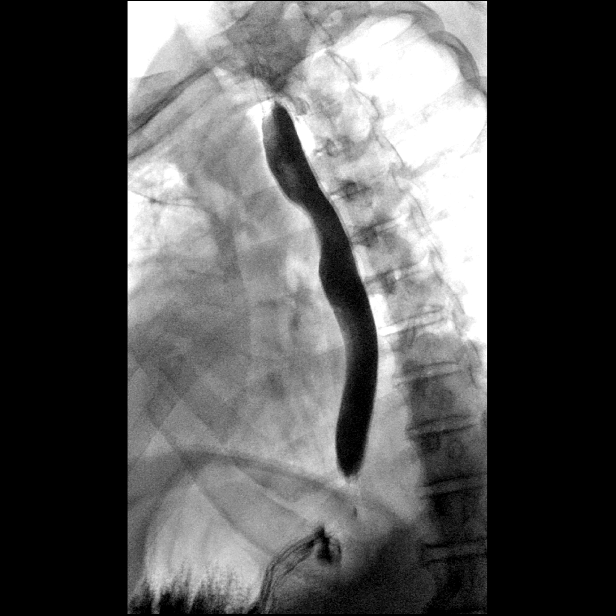
[frame 21/40]
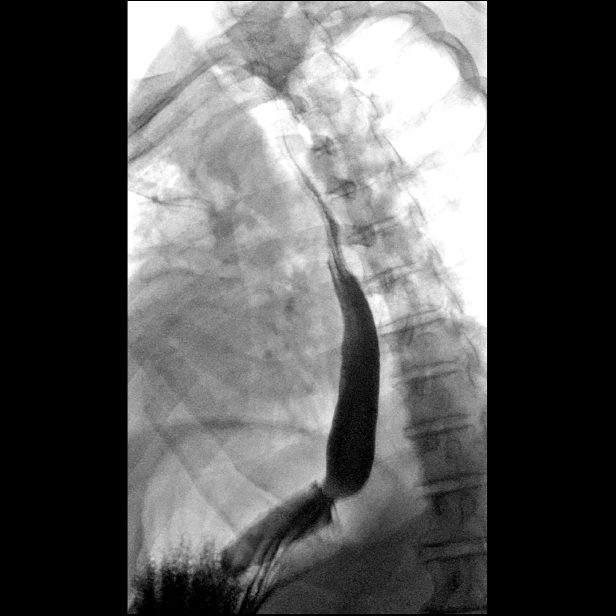

[Series 6: sequence · 1 of 97 frames shown (4 of 6)]
[frame 49/97]
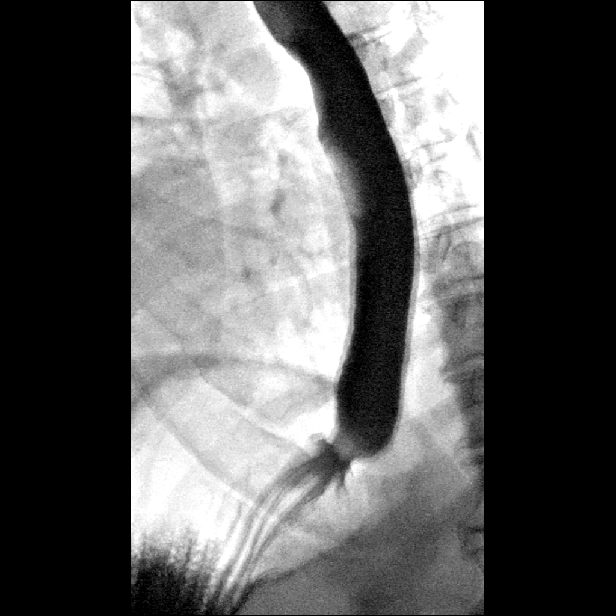

[Series 7: one shot · 1 of 1 slices shown (3 of 4)]
[im 1/1]
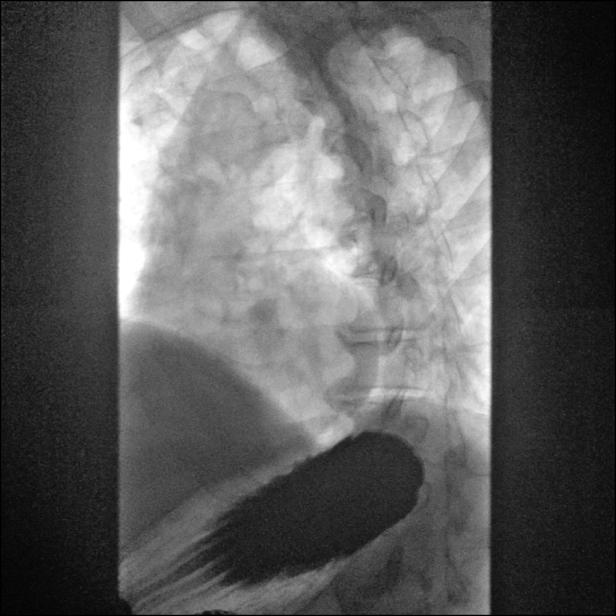

[Series 8: sequence · 2 of 91 frames shown (5 of 6)]
[frame 16/91]
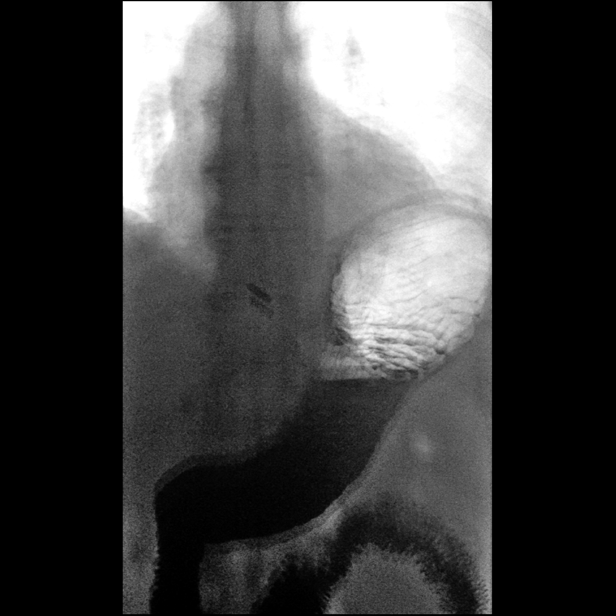
[frame 78/91]
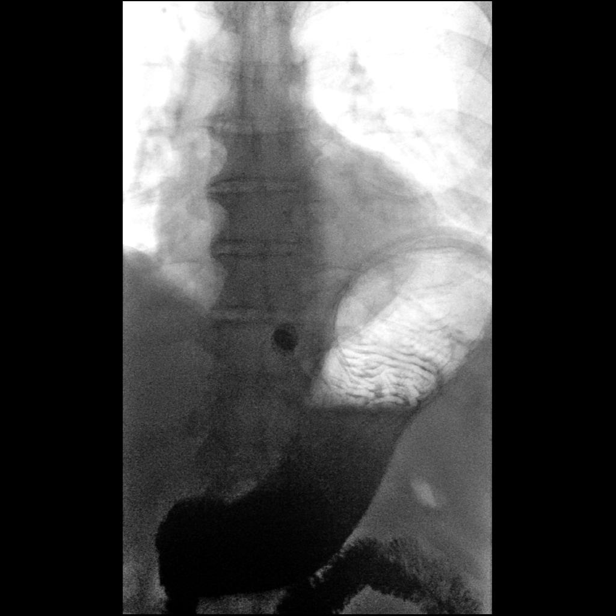

[Series 9: sequence · 1 of 11 frames shown (6 of 6)]
[frame 2/11]
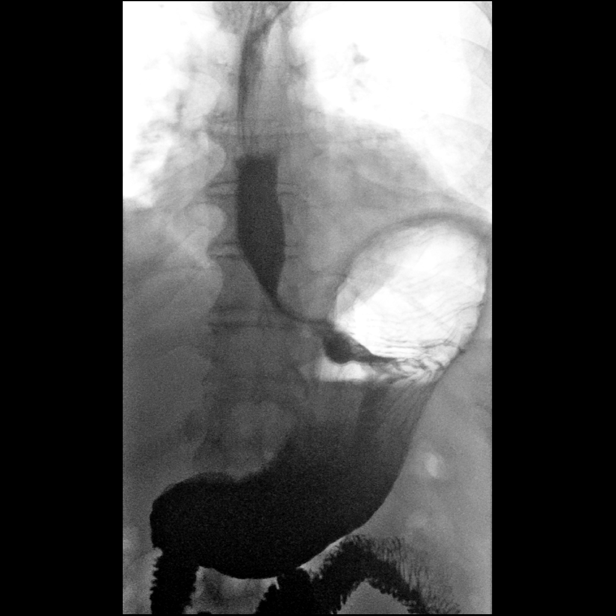

[Series 10: one shot · 1 of 1 slices shown (4 of 4)]
[im 1/1]
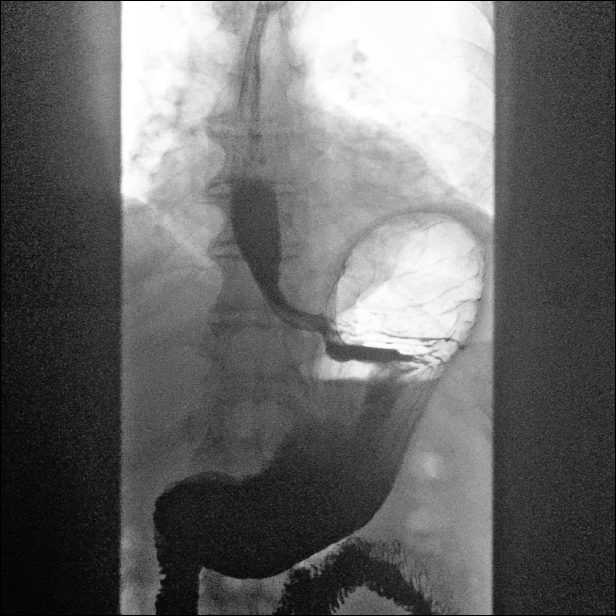

[14 of 24 positions shown; findings below may reference images not displayed]

FINDINGS: Normal oral and pharyngeal phases of swallowing, with no laryngeal
penetration or tracheobronchial aspiration. No significant barium
retention in the pharynx. No evidence of pharyngeal mass, stricture
or diverticulum. No significant cricopharyngeus muscle dysfunction.

There is mild esophageal dysmotility, characterized by intermittent
mild weakening of primary peristalsis in the mid to lower thoracic
esophagus. No hiatal hernia. No gastroesophageal reflux elicited,
despite provocative maneuvers including water siphon test. There is
mild granularity of the esophageal mucosa, suggesting mild reflux
esophagitis. There is minimal smooth concentric narrowing at the
esophagogastric junction. The barium tablet became transiently
delayed at the esophagogastric junction for approximately 1 minute,
with passage into the stomach after thin barium swallow. A
borderline peptic stricture cannot be excluded at the
esophagogastric junction. No evidence of esophageal mass or ulcer.
IMPRESSION: 1. No hiatal hernia.  No gastroesophageal reflux elicited.
2. Mild esophageal dysmotility, with a pattern characteristic of
chronic reflux related dysmotility.
3. Suggestion of mild reflux esophagitis.
4. Findings suggestive of a borderline peptic stricture at the
esophagogastric junction, see comments. No evidence of esophageal
mass or ulcer.

## 2021-08-13 DIAGNOSIS — L821 Other seborrheic keratosis: Secondary | ICD-10-CM | POA: Diagnosis not present

## 2021-08-13 DIAGNOSIS — D225 Melanocytic nevi of trunk: Secondary | ICD-10-CM | POA: Diagnosis not present

## 2021-08-13 DIAGNOSIS — D485 Neoplasm of uncertain behavior of skin: Secondary | ICD-10-CM | POA: Diagnosis not present

## 2021-08-13 DIAGNOSIS — L814 Other melanin hyperpigmentation: Secondary | ICD-10-CM | POA: Diagnosis not present

## 2021-08-13 DIAGNOSIS — D2239 Melanocytic nevi of other parts of face: Secondary | ICD-10-CM | POA: Diagnosis not present

## 2021-09-18 DIAGNOSIS — Z125 Encounter for screening for malignant neoplasm of prostate: Secondary | ICD-10-CM | POA: Diagnosis not present

## 2021-09-18 DIAGNOSIS — Z0000 Encounter for general adult medical examination without abnormal findings: Secondary | ICD-10-CM | POA: Diagnosis not present

## 2021-09-18 DIAGNOSIS — D485 Neoplasm of uncertain behavior of skin: Secondary | ICD-10-CM | POA: Diagnosis not present

## 2021-09-18 DIAGNOSIS — Z131 Encounter for screening for diabetes mellitus: Secondary | ICD-10-CM | POA: Diagnosis not present

## 2021-09-18 DIAGNOSIS — E669 Obesity, unspecified: Secondary | ICD-10-CM | POA: Diagnosis not present

## 2021-09-22 DIAGNOSIS — G43101 Migraine with aura, not intractable, with status migrainosus: Secondary | ICD-10-CM | POA: Diagnosis not present

## 2022-01-22 DIAGNOSIS — J329 Chronic sinusitis, unspecified: Secondary | ICD-10-CM | POA: Diagnosis not present

## 2022-01-22 DIAGNOSIS — J40 Bronchitis, not specified as acute or chronic: Secondary | ICD-10-CM | POA: Diagnosis not present

## 2022-06-02 DIAGNOSIS — D2239 Melanocytic nevi of other parts of face: Secondary | ICD-10-CM | POA: Diagnosis not present

## 2022-06-02 DIAGNOSIS — L821 Other seborrheic keratosis: Secondary | ICD-10-CM | POA: Diagnosis not present

## 2022-06-02 DIAGNOSIS — L814 Other melanin hyperpigmentation: Secondary | ICD-10-CM | POA: Diagnosis not present

## 2022-06-02 DIAGNOSIS — D225 Melanocytic nevi of trunk: Secondary | ICD-10-CM | POA: Diagnosis not present

## 2022-06-02 DIAGNOSIS — D485 Neoplasm of uncertain behavior of skin: Secondary | ICD-10-CM | POA: Diagnosis not present

## 2022-11-11 DIAGNOSIS — Z23 Encounter for immunization: Secondary | ICD-10-CM | POA: Diagnosis not present

## 2022-11-11 DIAGNOSIS — E669 Obesity, unspecified: Secondary | ICD-10-CM | POA: Diagnosis not present

## 2022-11-11 DIAGNOSIS — Z131 Encounter for screening for diabetes mellitus: Secondary | ICD-10-CM | POA: Diagnosis not present

## 2022-11-11 DIAGNOSIS — Z125 Encounter for screening for malignant neoplasm of prostate: Secondary | ICD-10-CM | POA: Diagnosis not present

## 2022-11-11 DIAGNOSIS — Z0000 Encounter for general adult medical examination without abnormal findings: Secondary | ICD-10-CM | POA: Diagnosis not present

## 2023-02-08 DIAGNOSIS — D485 Neoplasm of uncertain behavior of skin: Secondary | ICD-10-CM | POA: Diagnosis not present

## 2023-02-08 DIAGNOSIS — L918 Other hypertrophic disorders of the skin: Secondary | ICD-10-CM | POA: Diagnosis not present

## 2023-06-03 DIAGNOSIS — L821 Other seborrheic keratosis: Secondary | ICD-10-CM | POA: Diagnosis not present

## 2023-06-03 DIAGNOSIS — D2239 Melanocytic nevi of other parts of face: Secondary | ICD-10-CM | POA: Diagnosis not present

## 2023-06-03 DIAGNOSIS — D485 Neoplasm of uncertain behavior of skin: Secondary | ICD-10-CM | POA: Diagnosis not present

## 2023-06-03 DIAGNOSIS — L739 Follicular disorder, unspecified: Secondary | ICD-10-CM | POA: Diagnosis not present

## 2023-06-03 DIAGNOSIS — D225 Melanocytic nevi of trunk: Secondary | ICD-10-CM | POA: Diagnosis not present

## 2023-06-03 DIAGNOSIS — L820 Inflamed seborrheic keratosis: Secondary | ICD-10-CM | POA: Diagnosis not present

## 2023-10-25 DIAGNOSIS — M1712 Unilateral primary osteoarthritis, left knee: Secondary | ICD-10-CM | POA: Diagnosis not present

## 2023-10-30 ENCOUNTER — Ambulatory Visit (HOSPITAL_BASED_OUTPATIENT_CLINIC_OR_DEPARTMENT_OTHER): Admission: EM | Admit: 2023-10-30 | Discharge: 2023-10-30 | Disposition: A | Discharge status: Home / Self Care

## 2023-10-30 ENCOUNTER — Encounter (HOSPITAL_BASED_OUTPATIENT_CLINIC_OR_DEPARTMENT_OTHER): Payer: Self-pay

## 2023-10-30 ENCOUNTER — Ambulatory Visit (HOSPITAL_BASED_OUTPATIENT_CLINIC_OR_DEPARTMENT_OTHER): Admit: 2023-10-30 | Discharge: 2023-10-30 | Disposition: A | Discharge status: Home / Self Care | Admitting: Radiology

## 2023-10-30 DIAGNOSIS — R509 Fever, unspecified: Secondary | ICD-10-CM | POA: Diagnosis not present

## 2023-10-30 DIAGNOSIS — J320 Chronic maxillary sinusitis: Secondary | ICD-10-CM | POA: Diagnosis not present

## 2023-10-30 DIAGNOSIS — R059 Cough, unspecified: Secondary | ICD-10-CM

## 2023-10-30 DIAGNOSIS — Z79899 Other long term (current) drug therapy: Secondary | ICD-10-CM | POA: Diagnosis not present

## 2023-10-30 DIAGNOSIS — R0989 Other specified symptoms and signs involving the circulatory and respiratory systems: Secondary | ICD-10-CM | POA: Diagnosis not present

## 2023-10-30 DIAGNOSIS — R058 Other specified cough: Secondary | ICD-10-CM | POA: Diagnosis not present

## 2023-10-30 LAB — POC COVID19/FLU A&B COMBO
Covid Antigen, POC: NEGATIVE
Influenza A Antigen, POC: NEGATIVE
Influenza B Antigen, POC: NEGATIVE

## 2023-10-30 MED ORDER — AMOXICILLIN 875 MG PO TABS: 875.0000 mg | ORAL_TABLET | Freq: Two times a day (BID) | ORAL | 0 refills | Status: AC

## 2023-10-30 NOTE — Discharge Instructions (Signed)
 Take the antibiotics as prescribed for sinus infection.  You can continue Mucinex and other over-the-counter medications for symptoms as needed.  Follow-up as needed

## 2023-10-30 NOTE — ED Provider Notes (Signed)
 [name]    CSN: [id number] Arrival date & time: 10/30/23  0801      History   Chief Complaint Chief Complaint  Patient presents with   Fever   Head congestion   Cough    HPI Gregory Rivers is a 58 y.o. male.   Onset of head congestion 9 days ago. Has since developed a fever, cough, chest congestion. Productive cough. Has been taking flonase, astepro, aleve at home for symptoms.    Fever Associated symptoms: cough   Cough Associated symptoms: fever     Past Medical History:  Diagnosis Date   Hyperlipidemia    Hypertension    Hypogonadism male 2012    Patient Active Problem List   Diagnosis Date Noted   GERD (gastroesophageal reflux disease) 01/20/2012   DDD (degenerative disc disease), lumbar 09/23/2011   OSA (obstructive sleep apnea) 05/29/2011   Essential hypertension, benign 04/24/2011   Routine general medical examination at a health care facility 04/24/2011   Family history of prostate cancer 04/24/2011   Pure hypercholesterolemia 04/24/2011   Abnormal EKG 04/24/2011    History reviewed. No pertinent surgical history.     Home Medications    Prior to Admission medications   Medication Sig Start Date End Date Taking? Authorizing Provider  amLODipine -olmesartan  (AZOR ) 10-40 MG tablet Take 1 tablet by mouth daily. 08/10/23  Yes [provider]  amoxicillin  (AMOXIL ) 875 MG tablet Take 1 tablet (875 mg total) by mouth 2 (two) times daily for 7 days. 10/30/23 11/06/23 Yes Kelvis Berger A, FNP  fenofibrate  160 MG tablet Take 160 mg by mouth daily. 10/15/23  Yes [provider]  TRINTELLIX 20 MG TABS tablet Take 20 mg by mouth daily.    [provider]    Family History Family History  Problem Relation Age of Onset   Heart disease Mother    Hypertension Mother    Diabetes Mother    Prostate cancer Father    Hyperlipidemia Father    Heart disease Father    Stroke Father    Hypertension Father    Skin cancer  Father     Social History Social History   Tobacco Use   Smoking status: Never   Smokeless tobacco: Never  Substance Use Topics   Alcohol use: No   Drug use: No     Allergies   Statins   Review of Systems Review of Systems  Constitutional:  Positive for fever.  Respiratory:  Positive for cough.      Physical Exam Triage Vital Signs ED Triage Vitals  Encounter Vitals Group     BP 10/30/23 0854 136/83     Girls Systolic BP Percentile --      Girls Diastolic BP Percentile --      Boys Systolic BP Percentile --      Boys Diastolic BP Percentile --      Pulse Rate 10/30/23 0854 79     Resp 10/30/23 0854 20     Temp 10/30/23 0854 98.3 F (36.8 C)     Temp Source 10/30/23 0854 Oral     SpO2 10/30/23 0854 94 %     Weight --      Height --      Head Circumference --      Peak Flow --      Pain Score 10/30/23 0856 0     Pain Loc --      Pain Education --      Exclude from [employer]  Chart --    No data found.  Updated Vital Signs BP 136/83 (BP Location: Right Arm)   Pulse 79   Temp 98.3 F (36.8 C) (Oral)   Resp 20   SpO2 94%   Visual Acuity Right Eye Distance:   Left Eye Distance:   Bilateral Distance:    Right Eye Near:   Left Eye Near:    Bilateral Near:     Physical Exam Vitals and nursing note reviewed.  Constitutional:      General: He is not in acute distress.    Appearance: Normal appearance. He is not ill-appearing, toxic-appearing or diaphoretic.  HENT:     Right Ear: Tympanic membrane, ear canal and external ear normal.     Left Ear: Tympanic membrane, ear canal and external ear normal.     Nose: Congestion present.     Mouth/Throat:     Pharynx: Oropharynx is clear.  Eyes:     Conjunctiva/sclera: Conjunctivae normal.  Cardiovascular:     Rate and Rhythm: Normal rate and regular rhythm.     Pulses: Normal pulses.     Heart sounds: Normal heart sounds.  Pulmonary:     Effort: Pulmonary effort is normal.     Breath sounds: Normal  breath sounds.  Musculoskeletal:        General: Normal range of motion.  Skin:    General: Skin is warm and dry.  Neurological:     Mental Status: He is alert.  Psychiatric:        Mood and Affect: Mood normal.      UC Treatments / Results  Labs (all labs ordered are listed, but only abnormal results are displayed) Labs Reviewed  POC COVID19/FLU A&B COMBO - Normal    EKG   Radiology DG Chest 2 View Result Date: 10/30/2023 EXAM: 2 VIEW(S) XRAY OF THE CHEST 10/30/2023 09:16:06 AM COMPARISON: 03/25/06 CLINICAL HISTORY: Cough. Onset of head congestion 9 days ago. Has since developed a fever, cough, chest congestion. Productive cough. Has been taking flonase, astepro, aleve at home for symptoms. FINDINGS: LUNGS AND PLEURA: No focal pulmonary opacity. No pulmonary edema. No pleural effusion. No pneumothorax. HEART AND MEDIASTINUM: No acute abnormality of the cardiac and mediastinal silhouettes. BONES AND SOFT TISSUES: No acute osseous abnormality. IMPRESSION: 1. No acute process. Electronically signed by: Katheleen Faes MD 10/30/2023 09:34 AM EDT RP Workstation: [id number]    Procedures Procedures (including critical care time)  Medications Ordered in UC Medications - No data to display  Initial Impression / Assessment and Plan / UC Course  I have reviewed the triage vital signs and the nursing notes.  Pertinent labs & imaging results that were available during my care of the patient were reviewed by me and considered in my medical decision making (see chart for details).     Chronic recurrent sinusitis-no concerns on chest x-ray today.  Most likely sinusitis.  9 days of symptoms.  Treating with amoxicillin .  Recommend over-the-counter medication to include Mucinex for symptomatic treatment.  Follow-up as needed Final Clinical Impressions(s) / UC Diagnoses   Final diagnoses:  Chronic maxillary sinusitis     Discharge Instructions      Take the antibiotics as prescribed for  sinus infection.  You can continue Mucinex and other over-the-counter medications for symptoms as needed.  Follow-up as needed    ED Prescriptions     Medication Sig Dispense Auth. Provider   amoxicillin  (AMOXIL ) 875 MG tablet Take 1 tablet (875 mg total) by mouth  2 (two) times daily for 7 days. 14 tablet Adah Wilbert LABOR, FNP      PDMP not reviewed this encounter.   Adah Wilbert LABOR, FNP 10/30/23 1205

## 2023-10-30 NOTE — ED Triage Notes (Signed)
 Onset of head congestion 9 days ago. Has since developed a fever, cough, chest congestion. Productive cough. Has been taking flonase, astepro, aleve at home for symptoms.

## 2023-11-01 DIAGNOSIS — Z6830 Body mass index (BMI) 30.0-30.9, adult: Secondary | ICD-10-CM | POA: Diagnosis not present

## 2023-11-01 DIAGNOSIS — J40 Bronchitis, not specified as acute or chronic: Secondary | ICD-10-CM | POA: Diagnosis not present

## 2023-11-01 DIAGNOSIS — H109 Unspecified conjunctivitis: Secondary | ICD-10-CM | POA: Diagnosis not present

## 2023-11-01 DIAGNOSIS — J329 Chronic sinusitis, unspecified: Secondary | ICD-10-CM | POA: Diagnosis not present

## 2023-11-08 DIAGNOSIS — R051 Acute cough: Secondary | ICD-10-CM | POA: Diagnosis not present

## 2023-11-08 DIAGNOSIS — Z6830 Body mass index (BMI) 30.0-30.9, adult: Secondary | ICD-10-CM | POA: Diagnosis not present

## 2023-12-09 DIAGNOSIS — Y758 Miscellaneous neurological devices associated with adverse incidents, not elsewhere classified: Secondary | ICD-10-CM | POA: Diagnosis not present

## 2023-12-09 DIAGNOSIS — Z13220 Encounter for screening for lipoid disorders: Secondary | ICD-10-CM | POA: Diagnosis not present

## 2023-12-09 DIAGNOSIS — Z131 Encounter for screening for diabetes mellitus: Secondary | ICD-10-CM | POA: Diagnosis not present

## 2023-12-09 DIAGNOSIS — Z0000 Encounter for general adult medical examination without abnormal findings: Secondary | ICD-10-CM | POA: Diagnosis not present

## 2023-12-09 DIAGNOSIS — Z125 Encounter for screening for malignant neoplasm of prostate: Secondary | ICD-10-CM | POA: Diagnosis not present

## 2024-02-01 DIAGNOSIS — H04123 Dry eye syndrome of bilateral lacrimal glands: Secondary | ICD-10-CM | POA: Diagnosis not present

## 2024-02-01 DIAGNOSIS — H11153 Pinguecula, bilateral: Secondary | ICD-10-CM | POA: Diagnosis not present
